# Patient Record
Sex: Female | Born: 1975 | Race: Black or African American | Hispanic: No | Marital: Married | Smoking: Never smoker
Health system: Southern US, Community
[De-identification: ages and names within clinical notes are randomized; demographics above are authoritative.]

## PROBLEM LIST (undated history)

## (undated) DIAGNOSIS — O09529 Supervision of elderly multigravida, unspecified trimester: Secondary | ICD-10-CM

## (undated) DIAGNOSIS — D649 Anemia, unspecified: Secondary | ICD-10-CM

## (undated) HISTORY — DX: Anemia, unspecified: D64.9

## (undated) HISTORY — PX: KNEE SURGERY: SHX244

## (undated) HISTORY — DX: Supervision of elderly multigravida, unspecified trimester: O09.529

---

## 2004-06-17 ENCOUNTER — Encounter (INDEPENDENT_AMBULATORY_CARE_PROVIDER_SITE_OTHER): Payer: Self-pay | Admitting: *Deleted

## 2004-06-17 ENCOUNTER — Ambulatory Visit (HOSPITAL_COMMUNITY): Admission: RE | Admit: 2004-06-17 | Discharge: 2004-06-17 | Payer: Self-pay | Admitting: Obstetrics & Gynecology

## 2005-04-05 ENCOUNTER — Encounter: Admission: RE | Admit: 2005-04-05 | Discharge: 2005-04-05 | Payer: Self-pay | Admitting: Emergency Medicine

## 2006-08-28 ENCOUNTER — Ambulatory Visit: Payer: Self-pay | Admitting: Family Medicine

## 2006-11-05 ENCOUNTER — Emergency Department (HOSPITAL_COMMUNITY): Admission: EM | Admit: 2006-11-05 | Discharge: 2006-11-05 | Payer: Self-pay | Admitting: Family Medicine

## 2009-03-01 ENCOUNTER — Inpatient Hospital Stay (HOSPITAL_COMMUNITY): Admission: AD | Admit: 2009-03-01 | Discharge: 2009-03-04 | Payer: Self-pay | Admitting: Obstetrics and Gynecology

## 2010-03-21 ENCOUNTER — Encounter: Payer: Self-pay | Admitting: Family Medicine

## 2010-05-16 LAB — CBC
HCT: 30.4 % — ABNORMAL LOW (ref 36.0–46.0)
HCT: 32.9 % — ABNORMAL LOW (ref 36.0–46.0)
Hemoglobin: 11.2 g/dL — ABNORMAL LOW (ref 12.0–15.0)
MCHC: 33.8 g/dL (ref 30.0–36.0)
MCV: 93 fL (ref 78.0–100.0)
RDW: 13.6 % (ref 11.5–15.5)
WBC: 11.5 10*3/uL — ABNORMAL HIGH (ref 4.0–10.5)

## 2010-05-16 LAB — RPR: RPR Ser Ql: NONREACTIVE

## 2010-07-16 NOTE — Op Note (Signed)
NAMEKAREENA, [name]             ACCOUNT NO.:  [id number]   MEDICAL RECORD NO.:  [id number]          PATIENT TYPE:  AMB   LOCATION:  SDC                           FACILITY:  [facility]   PHYSICIAN:  Genia Del, M.D.DATE OF BIRTH:  May 14, 1975   DATE OF PROCEDURE:  06/17/2004  DATE OF DISCHARGE:                                 OPERATIVE REPORT   PREOPERATIVE DIAGNOSIS:  7+ weeks gestation, undesired.   POSTOPERATIVE DIAGNOSIS:  7+ weeks gestation, undesired.   PROCEDURE:  Dilatation and evacuation.   SURGEON:  Genia Del, M.D.   ANESTHESIA:  Burnett Corrente, M.D.   PROCEDURE:  Under MAC analgesia, the patient was in the lithotomy position.  She was prepped with technical solution on the suprapubic, vulvar, and  vaginal areas.  The vaginal exam reveals an anteverted uterus about 8 cm,  mobile, no adnexal mass.  The cervix is long and closed, no bleeding.  A  speculum was inserted in the vagina.  The anterior lip of the cervix was  grasped with a tenaculum.  A paracervical block was done with Xylocaine 1%  plain 20 cc total at 4 and 8 o'clock.  Dilatation of the cervix was achieved  with Hagar dilators up to #31 without difficulty.  We then used a #9 curved  curette for suction of products of conception which were sent to pathology.  Then, a sharp curette was used to systematically curettage all intrauterine  surfaces.  The uterine sound was heard.  The suction was used to empty the  intrauterine cavity of any blood clots or products of conception.  The  uterus contracted well.  Hemostasis was adequate.  All instruments were  removed.  The estimated blood loss was about 50 cc.  No complications  occurred, and the patient was brought to the recovery room in good status.      ML/MEDQ  D:  06/17/2004  T:  06/17/2004  Job:  [id number]

## 2010-12-10 LAB — WET PREP, GENITAL: Yeast Wet Prep HPF POC: NONE SEEN

## 2010-12-10 LAB — GC/CHLAMYDIA PROBE AMP, GENITAL
Chlamydia, DNA Probe: NEGATIVE
GC Probe Amp, Genital: NEGATIVE

## 2011-06-26 ENCOUNTER — Encounter (HOSPITAL_COMMUNITY): Payer: Self-pay | Admitting: *Deleted

## 2011-06-26 ENCOUNTER — Emergency Department (HOSPITAL_COMMUNITY)
Admission: EM | Admit: 2011-06-26 | Discharge: 2011-06-26 | Disposition: A | Discharge status: Home / Self Care | Payer: No Typology Code available for payment source | Attending: Emergency Medicine | Admitting: Emergency Medicine

## 2011-06-26 DIAGNOSIS — M546 Pain in thoracic spine: Secondary | ICD-10-CM | POA: Insufficient documentation

## 2011-06-26 DIAGNOSIS — T148XXA Other injury of unspecified body region, initial encounter: Secondary | ICD-10-CM

## 2011-06-26 DIAGNOSIS — Z043 Encounter for examination and observation following other accident: Secondary | ICD-10-CM | POA: Insufficient documentation

## 2011-06-26 MED ORDER — METHOCARBAMOL 500 MG PO TABS: 500.0000 mg | ORAL_TABLET | Freq: Two times a day (BID) | ORAL | Status: AC

## 2011-06-26 MED ORDER — TRAMADOL HCL 50 MG PO TABS: 50.0000 mg | ORAL_TABLET | Freq: Four times a day (QID) | ORAL | Status: AC | PRN

## 2011-06-26 MED ORDER — IBUPROFEN 800 MG PO TABS: 800.0000 mg | ORAL_TABLET | Freq: Three times a day (TID) | ORAL | Status: AC
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
## 2011-06-28 NOTE — ED Provider Notes (Signed)
Medical screening examination/treatment/procedure(s) were performed by non-physician practitioner and as supervising physician I was immediately available for consultation/collaboration.  Raeford Razor, MD 06/28/11 1626

## 2015-08-22 ENCOUNTER — Emergency Department (HOSPITAL_COMMUNITY)
Admission: EM | Admit: 2015-08-22 | Discharge: 2015-08-22 | Disposition: A | Discharge status: Home / Self Care | Payer: BLUE CROSS/BLUE SHIELD | Attending: Emergency Medicine | Admitting: Emergency Medicine

## 2015-08-22 ENCOUNTER — Encounter (HOSPITAL_COMMUNITY): Payer: Self-pay | Admitting: Emergency Medicine

## 2015-08-22 ENCOUNTER — Emergency Department (HOSPITAL_COMMUNITY): Payer: BLUE CROSS/BLUE SHIELD

## 2015-08-22 DIAGNOSIS — Y92524 Gas station as the place of occurrence of the external cause: Secondary | ICD-10-CM | POA: Diagnosis not present

## 2015-08-22 DIAGNOSIS — Y9301 Activity, walking, marching and hiking: Secondary | ICD-10-CM | POA: Insufficient documentation

## 2015-08-22 DIAGNOSIS — S8991XA Unspecified injury of right lower leg, initial encounter: Secondary | ICD-10-CM | POA: Diagnosis present

## 2015-08-22 DIAGNOSIS — S8001XA Contusion of right knee, initial encounter: Secondary | ICD-10-CM | POA: Diagnosis not present

## 2015-08-22 DIAGNOSIS — Y999 Unspecified external cause status: Secondary | ICD-10-CM | POA: Diagnosis not present

## 2015-08-22 MED ORDER — HYDROCODONE-ACETAMINOPHEN 5-325 MG PO TABS: 1.0000 | ORAL_TABLET | Freq: Four times a day (QID) | ORAL | Status: DC | PRN

## 2015-08-22 MED ORDER — IBUPROFEN 400 MG PO TABS
600.0000 mg | ORAL_TABLET | Freq: Once | ORAL | Status: AC
  Administered 2015-08-22: 600 mg via ORAL
  Filled 2015-08-22: qty 1

## 2015-08-22 MED ORDER — HYDROCODONE-ACETAMINOPHEN 5-325 MG PO TABS
1.0000 | ORAL_TABLET | Freq: Once | ORAL | Status: AC
  Administered 2015-08-22: 1 via ORAL
  Filled 2015-08-22: qty 1

## 2015-08-22 NOTE — ED Notes (Signed)
Patient transported to X-ray 
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
## 2015-08-22 NOTE — ED Notes (Signed)
Registration just changed pts DOB to reflect June 06, 1975, changed on prescription that was already printed out.

## 2015-08-22 NOTE — Discharge Instructions (Signed)
You can take ibuprofen, available over the counter as needed for pain.  Drink plenty of fluids and keep your leg elevated.  Get rechecked immediately if you develop worsening pain, increased swelling in your leg, or dark colored urine.     Contusion A contusion is a deep bruise. Contusions are the result of a blunt injury to tissues and muscle fibers under the skin. The injury causes bleeding under the skin. The skin overlying the contusion may turn blue, purple, or yellow. Minor injuries will give you a painless contusion, but more severe contusions may stay painful and swollen for a few weeks.  CAUSES  This condition is usually caused by a blow, trauma, or direct force to an area of the body. SYMPTOMS  Symptoms of this condition include:  Swelling of the injured area.  Pain and tenderness in the injured area.  Discoloration. The area may have redness and then turn blue, purple, or yellow. DIAGNOSIS  This condition is diagnosed based on a physical exam and medical history. An X-ray, CT scan, or MRI may be needed to determine if there are any associated injuries, such as broken bones (fractures). TREATMENT  Specific treatment for this condition depends on what area of the body was injured. In general, the best treatment for a contusion is resting, icing, applying pressure to (compression), and elevating the injured area. This is often called the RICE strategy. Over-the-counter anti-inflammatory medicines may also be recommended for pain control.  HOME CARE INSTRUCTIONS   Rest the injured area.  If directed, apply ice to the injured area:  Put ice in a plastic bag.  Place a towel between your skin and the bag.  Leave the ice on for 20 minutes, 2-3 times per day.  If directed, apply light compression to the injured area using an elastic bandage. Make sure the bandage is not wrapped too tightly. Remove and reapply the bandage as directed by your health care provider.  If possible, raise  (elevate) the injured area above the level of your heart while you are sitting or lying down.  Take over-the-counter and prescription medicines only as told by your health care provider. SEEK MEDICAL CARE IF:  Your symptoms do not improve after several days of treatment.  Your symptoms get worse.  You have difficulty moving the injured area. SEEK IMMEDIATE MEDICAL CARE IF:   You have severe pain.  You have numbness in a hand or foot.  Your hand or foot turns pale or cold.   This information is not intended to replace advice given to you by your health care provider. Make sure you discuss any questions you have with your health care provider.   Document Released: 11/24/2004 Document Revised: 11/05/2014 Document Reviewed: 07/02/2014 Elsevier Interactive Patient Education 2016 [employer] Acute Compartment Syndrome Compartment syndrome is a painful condition that occurs when swelling and pressure build up in a body space (compartment) of the arms or legs. Groups of muscles, nerves, and blood vessels in the arms and legs are separated into various compartments. Each compartment is surrounded by tough layers of tissue called fascia. In compartment syndrome, pressure builds up within the layers of fascia and begins to push on the structures within that compartment.  In acute compartment syndrome, the pressure builds up suddenly, often as the result of an injury. This is a surgical emergency. When a muscle in the compartment moves, you may feel severe pain. If pressure continues to increase, it can block the flow of blood in the  smallest blood vessels (capillaries). Then, the nerves and muscles in the compartment cannot get enough oxygen and nutrients (substances needed for survival). They will start to die within 4-8 hours. That is why the pressure needs to be relieved immediately. Identifying the condition early and treating it quickly can prevent most problems. CAUSES  Various things can  lead to compartment syndrome. Possible causes include:   Injury. Some injuries can cause swelling or bleeding in a compartment. This can lead to compartment syndrome. Injuries that may cause this problem include:  Broken bones, especially the long bones of the arms and legs.  Crushing injuries.  Penetrating injuries, such as a knife wound that punctures the skin and tissue underneath.  Badly bruised muscles.  Poisonous bites, such as a snake bite.  Severe burns.  Blocked blood flow. This could result from:  A cast or bandage that is too tight.  A surgical procedure. Blood flow sometimes has to be stopped for a while during a surgery, usually with a tourniquet.  Lying for too long in a position that restricts blood flow. This can happen in people who have nerve damage or if a person is unconscious for a long time.  Drugs used to build up muscles (anabolic steroids).  Drugs that keep the blood from forming clots (blood thinners). SIGNS AND SYMPTOMS  The most common symptom of compartment syndrome is pain. The pain may:   Get worse when moving or stretching the affected body part.  Be more severe than it should be for an injury.  Come along with a feeling of tingling or burning.  Become worse when the area is pushed or squeezed.  Be unaffected by pain medicine. Other symptoms include:   A feeling of tightness or fullness in the affected area.   A loss of feeling.  Weakness in the area.  Loss of movement.  Skin becoming pale, tight, and shiny over the painful area.  DIAGNOSIS  Your health care provider may suspect the problem based on how you describe the pain. The diagnosis is made by using a special device that measures the pressure in the affected area. Blood tests, X-rays, or an ultrasound exam may be done to help rule out other problems.  TREATMENT  Compartment syndrome is a surgical emergency. It should be treated very quickly.   First-aid treatment is given  first. This may include:  Promptly treating an injury.  Loosening or removing any cast, bandage, or external wrap that may be causing pain.  Raising the painful arm or leg to the same level as the heart.  Giving oxygen.  Giving fluid through an IV access tube that is put into a vein in the hand or arm.  Surgery (fasciotomy) is needed to relieve the pressure and help prevent permanent damage. In this surgery, cuts (incisions) are made through the fascia to relieve the pressure in the compartment.   This information is not intended to replace advice given to you by your health care provider. Make sure you discuss any questions you have with your health care provider.   Document Released: 02/02/2009 Document Revised: 10/17/2012 Document Reviewed: 09/18/2012 Elsevier Interactive Patient Education [employer] Else[employer]

## 2015-08-22 NOTE — ED Notes (Signed)
Downgraded by Dr Madilyn Hookees
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
## 2015-08-24 ENCOUNTER — Encounter (HOSPITAL_COMMUNITY): Payer: Self-pay | Admitting: *Deleted

## 2017-01-26 LAB — OB RESULTS CONSOLE ABO/RH: RH TYPE: POSITIVE

## 2017-01-26 LAB — OB RESULTS CONSOLE RPR: RPR: NONREACTIVE

## 2017-01-26 LAB — OB RESULTS CONSOLE ANTIBODY SCREEN: ANTIBODY SCREEN: NEGATIVE

## 2017-01-26 LAB — OB RESULTS CONSOLE RUBELLA ANTIBODY, IGM: Rubella: IMMUNE

## 2017-01-26 LAB — OB RESULTS CONSOLE HEPATITIS B SURFACE ANTIGEN: Hepatitis B Surface Ag: NEGATIVE

## 2017-01-26 LAB — OB RESULTS CONSOLE GC/CHLAMYDIA
Chlamydia: NEGATIVE
Gonorrhea: NEGATIVE

## 2017-01-26 LAB — OB RESULTS CONSOLE HIV ANTIBODY (ROUTINE TESTING): HIV: NONREACTIVE

## 2017-02-28 NOTE — L&D Delivery Note (Signed)
Delivery Note At 9:41 AM a viable female was delivered in hands and knees position via Vaginal, Spontaneous (Presentation:direct OA ).  APGAR: 8, 9; weight 5 lb 9.8 oz (2545 g).   Placenta status: placenta , spontaneous.  Cord:  with the following complications: Nuchal cord x 1, compound presentation of right hand.  Cord pH: n/a Baby placed at bedside to awaiting NICU team.  FSE cut after delivery.  Removed from scalp but entangled in hair. Cut with scissors Anesthesia:  Lidocacine 1% Episiotomy: None Lacerations: 1st degree; right Labial Suture Repair: 3.0 vicryl 4.0 Est. Blood Loss (mL): 400  Mom to postpartum.  Baby to Couplet care / Skin to Skin.  Stephanie Hood 08/13/2017, 10:44 AM

## 2017-07-25 LAB — OB RESULTS CONSOLE GBS: STREP GROUP B AG: POSITIVE

## 2017-08-08 ENCOUNTER — Encounter (HOSPITAL_COMMUNITY): Payer: Self-pay | Admitting: *Deleted

## 2017-08-08 ENCOUNTER — Telehealth (HOSPITAL_COMMUNITY): Payer: Self-pay | Admitting: *Deleted

## 2017-08-08 NOTE — Telephone Encounter (Signed)
Preadmission screen  

## 2017-08-09 ENCOUNTER — Other Ambulatory Visit: Payer: Self-pay | Admitting: Obstetrics and Gynecology

## 2017-08-12 ENCOUNTER — Encounter (HOSPITAL_COMMUNITY): Payer: Self-pay

## 2017-08-12 ENCOUNTER — Inpatient Hospital Stay (HOSPITAL_COMMUNITY)
Admission: AD | Admit: 2017-08-12 | Discharge: 2017-08-16 | DRG: 807 | Disposition: A | Discharge status: Home / Self Care | Payer: BLUE CROSS/BLUE SHIELD | Attending: Obstetrics & Gynecology | Admitting: Obstetrics & Gynecology

## 2017-08-12 DIAGNOSIS — O326XX0 Maternal care for compound presentation, not applicable or unspecified: Secondary | ICD-10-CM | POA: Diagnosis present

## 2017-08-12 DIAGNOSIS — O1414 Severe pre-eclampsia complicating childbirth: Secondary | ICD-10-CM | POA: Diagnosis present

## 2017-08-12 DIAGNOSIS — R030 Elevated blood-pressure reading, without diagnosis of hypertension: Secondary | ICD-10-CM | POA: Diagnosis present

## 2017-08-12 DIAGNOSIS — O99824 Streptococcus B carrier state complicating childbirth: Secondary | ICD-10-CM | POA: Diagnosis present

## 2017-08-12 DIAGNOSIS — Z3A38 38 weeks gestation of pregnancy: Secondary | ICD-10-CM

## 2017-08-12 DIAGNOSIS — O1490 Unspecified pre-eclampsia, unspecified trimester: Secondary | ICD-10-CM | POA: Diagnosis present

## 2017-08-12 LAB — COMPREHENSIVE METABOLIC PANEL
ALBUMIN: 2.7 g/dL — AB (ref 3.5–5.0)
ALK PHOS: 147 U/L — AB (ref 38–126)
ALT: 27 U/L (ref 14–54)
ANION GAP: 9 (ref 5–15)
AST: 38 U/L (ref 15–41)
BILIRUBIN TOTAL: 0.5 mg/dL (ref 0.3–1.2)
BUN: 10 mg/dL (ref 6–20)
CALCIUM: 8.8 mg/dL — AB (ref 8.9–10.3)
CO2: 20 mmol/L — ABNORMAL LOW (ref 22–32)
CREATININE: 0.77 mg/dL (ref 0.44–1.00)
Chloride: 111 mmol/L (ref 101–111)
GFR calc Af Amer: >60 mL/min (ref >60)
GFR calc non Af Amer: >60 mL/min (ref >60)
GLUCOSE: 97 mg/dL (ref 65–99)
Potassium: 3.7 mmol/L (ref 3.5–5.1)
Sodium: 140 mmol/L (ref 135–145)
TOTAL PROTEIN: 5.5 g/dL — AB (ref 6.5–8.1)

## 2017-08-12 LAB — TYPE AND SCREEN
ABO/RH(D): O POS
Antibody Screen: NEGATIVE

## 2017-08-12 LAB — URINALYSIS, ROUTINE W REFLEX MICROSCOPIC
BILIRUBIN URINE: NEGATIVE
GLUCOSE, UA: NEGATIVE mg/dL
HGB URINE DIPSTICK: NEGATIVE
KETONES UR: NEGATIVE mg/dL
NITRITE: NEGATIVE
PH: 6 (ref 5.0–8.0)
Protein, ur: >=300 mg/dL — AB
SPECIFIC GRAVITY, URINE: 1.018 (ref 1.005–1.030)

## 2017-08-12 LAB — CBC
HEMATOCRIT: 30.3 % — AB (ref 36.0–46.0)
Hemoglobin: 9.9 g/dL — ABNORMAL LOW (ref 12.0–15.0)
MCH: 28.8 pg (ref 26.0–34.0)
MCHC: 32.7 g/dL (ref 30.0–36.0)
MCV: 88.1 fL (ref 78.0–100.0)
Platelets: 143 10*3/uL — ABNORMAL LOW (ref 150–400)
RBC: 3.44 MIL/uL — ABNORMAL LOW (ref 3.87–5.11)
RDW: 14.2 % (ref 11.5–15.5)
WBC: 7.9 10*3/uL (ref 4.0–10.5)

## 2017-08-12 LAB — PROTEIN / CREATININE RATIO, URINE
Creatinine, Urine: 217 mg/dL
Protein Creatinine Ratio: 1.92 mg/mg{Cre} — ABNORMAL HIGH (ref 0.00–0.15)
Total Protein, Urine: 416 mg/dL

## 2017-08-12 MED ORDER — LABETALOL HCL 5 MG/ML IV SOLN
20.0000 mg | INTRAVENOUS | Status: AC | PRN
  Administered 2017-08-12: 20 mg via INTRAVENOUS
  Administered 2017-08-13: 80 mg via INTRAVENOUS
  Administered 2017-08-13: 40 mg via INTRAVENOUS
  Filled 2017-08-12: qty 8
  Filled 2017-08-12: qty 16

## 2017-08-12 MED ORDER — MISOPROSTOL 25 MCG QUARTER TABLET
25.0000 ug | ORAL_TABLET | ORAL | Status: DC | PRN
  Administered 2017-08-12 – 2017-08-13 (×2): 25 ug via VAGINAL
  Filled 2017-08-12 (×3): qty 1

## 2017-08-12 MED ORDER — TERBUTALINE SULFATE 1 MG/ML IJ SOLN: 0.2500 mg | Freq: Once | INTRAMUSCULAR | Status: DC | PRN

## 2017-08-12 MED ORDER — LACTATED RINGERS IV SOLN
500.0000 mL | INTRAVENOUS | Status: DC | PRN
  Administered 2017-08-13: 500 mL via INTRAVENOUS

## 2017-08-12 MED ORDER — LACTATED RINGERS IV SOLN
INTRAVENOUS | Status: DC
  Administered 2017-08-12 – 2017-08-13 (×2): via INTRAVENOUS

## 2017-08-12 MED ORDER — OXYTOCIN BOLUS FROM INFUSION: 500.0000 mL | Freq: Once | INTRAVENOUS | Status: DC

## 2017-08-12 MED ORDER — LIDOCAINE HCL (PF) 1 % IJ SOLN
30.0000 mL | INTRAMUSCULAR | Status: AC | PRN
  Administered 2017-08-13: 30 mL via SUBCUTANEOUS
  Filled 2017-08-12: qty 30

## 2017-08-12 MED ORDER — ZOLPIDEM TARTRATE 5 MG PO TABS
5.0000 mg | ORAL_TABLET | Freq: Once | ORAL | Status: AC
  Administered 2017-08-12: 5 mg via ORAL
  Filled 2017-08-12: qty 1

## 2017-08-12 MED ORDER — OXYCODONE-ACETAMINOPHEN 5-325 MG PO TABS: 2.0000 | ORAL_TABLET | ORAL | Status: DC | PRN

## 2017-08-12 MED ORDER — SODIUM CHLORIDE 0.9 % IV SOLN
5.0000 10*6.[IU] | Freq: Once | INTRAVENOUS | Status: AC
  Administered 2017-08-12: 5 10*6.[IU] via INTRAVENOUS
  Filled 2017-08-12: qty 5

## 2017-08-12 MED ORDER — HYDRALAZINE HCL 20 MG/ML IJ SOLN
10.0000 mg | Freq: Once | INTRAMUSCULAR | Status: AC | PRN
  Administered 2017-08-13: 10 mg via INTRAVENOUS
  Filled 2017-08-12: qty 1

## 2017-08-12 MED ORDER — ONDANSETRON HCL 4 MG/2ML IJ SOLN: 4.0000 mg | Freq: Four times a day (QID) | INTRAMUSCULAR | Status: DC | PRN

## 2017-08-12 MED ORDER — OXYCODONE-ACETAMINOPHEN 5-325 MG PO TABS: 1.0000 | ORAL_TABLET | ORAL | Status: DC | PRN

## 2017-08-12 MED ORDER — SOD CITRATE-CITRIC ACID 500-334 MG/5ML PO SOLN: 30.0000 mL | ORAL | Status: DC | PRN

## 2017-08-12 MED ORDER — LABETALOL HCL 5 MG/ML IV SOLN
INTRAVENOUS | Status: AC
  Filled 2017-08-12: qty 4

## 2017-08-12 MED ORDER — ACETAMINOPHEN 325 MG PO TABS
650.0000 mg | ORAL_TABLET | ORAL | Status: DC | PRN
  Administered 2017-08-13: 650 mg via ORAL
  Filled 2017-08-12: qty 2

## 2017-08-12 MED ORDER — PENICILLIN G POT IN DEXTROSE 60000 UNIT/ML IV SOLN
3.0000 10*6.[IU] | INTRAVENOUS | Status: DC
  Administered 2017-08-13 (×3): 3 10*6.[IU] via INTRAVENOUS
  Filled 2017-08-12 (×4): qty 50

## 2017-08-12 MED ORDER — OXYTOCIN 40 UNITS IN LACTATED RINGERS INFUSION - SIMPLE MED
2.5000 [IU]/h | INTRAVENOUS | Status: DC
  Administered 2017-08-13: 2.5 [IU]/h via INTRAVENOUS
  Filled 2017-08-12: qty 1000

## 2017-08-12 NOTE — H&P (Signed)
Stephanie Hood is a 42 y.o. female, G4P3003 at 38.4  weeks, presenting for increased BP.  Denies headache, blurred vision or epigastric pain.  FM+.  States has had increased BP in office.  Was suppose to be induced on Tuesday.  Pregnancy hx remarkable for GBS positive and AMA.  There are no active problems to display for this patient.   History of present pregnancy: Patient entered care at 10.2 weeks.   EDC of 08/22/2017 was established by LMP.   Anatomy scan:  20 weeks, with normal findings and an anterior placenta.   Additional US evaluations:  Growth.   Significant prenatal events:increased BP  Last evaluation:  Friday  OB History    Gravida  4   Para  3   Term  3   Preterm  0   AB  0   Living  2     SAB  0   TAB  0   Ectopic  0   Multiple      Live Births  2          Past Medical History:  Diagnosis Date  . AMA (advanced maternal age) multigravida 35+   . Anemia    Past Surgical History:  Procedure Laterality Date  . KNEE SURGERY     Family History: family history is not on file. Social History:  reports that she has never smoked. She has never used smokeless tobacco. She reports that she drinks alcohol. She reports that she does not use drugs.   Prenatal Transfer Tool  Maternal Diabetes: No Genetic Screening: Negative Maternal Ultrasounds/Referrals: None Fetal Ultrasounds or other Referrals:  None Maternal Substance Abuse:  No Significant Maternal Medications:  None Significant Maternal Lab Results: None  TDAP declined Flu declined  JXB:JYNWGNROS:Review of Systems  Constitutional: Negative.   HENT: Negative.   Eyes: Negative.   Cardiovascular: Positive for leg swelling.  Skin: Negative.      No Known Allergies     Blood pressure (!) 169/86, pulse 63, temperature 98.2 F (36.8 C), temperature source Oral, resp. rate 18, height 5\' 6"  (1.676 m), weight 103 kg (227 lb), last menstrual period 11/15/2016, SpO2 100 %.  Chest clear Heart RRR  without murmur Abd gravid, NT, FH apporpriate Pelvic: Deferred Ext: +1/+1 pitting edema, neg clonus  FHR: Category 1  FHT BL 135 accels noted.  Variability present UCs:  None  Prenatal labs: ABO, Rh: O/Positive/-- (11/29 0000) Antibody: Negative (11/29 0000) Rubella:  Immune (11/29 0000) RPR: Nonreactive (11/29 0000)  HBsAg: Negative (11/29 0000)  HIV: Non-reactive (11/29 0000)  GBS: Positive (05/28 0000) Sickle cell/Hgb electrophoresis:  AA Pap:  2018 GC:  Neg Chlamydia:  Neg Genetic screenings:  Declined Glucola: 92 Other:   Hgb 11.7 at NOB, 10.8 at 28 weeks       Assessment/Plan: IUP at 38.4 pre eclampsia with severe features Cat 1 strip AMA GBS positive  Plan: Admit to Birthing Suite  Routine [facility] orders Pain med/epidural prn PCN G for GBS prophylaxis   Henderson NewcomerNancy Jean ProtheroCNM, MSN 08/12/2017, 8:52 PM

## 2017-08-12 NOTE — MAU Note (Signed)
Pt here for elevated BPs. States she was at the office yesterday and BP was 140'/90's and she was told she had "a lot of protein in urine". States she had a HA today-has eased since, but checked her BP today and it has been 140's-160's/90's. Denies vision changes or RUQ pain. States she "feels feverish". Pt denies LOF or vaginal bleeding. Reports good fetal movement.

## 2017-08-13 ENCOUNTER — Other Ambulatory Visit: Payer: Self-pay

## 2017-08-13 ENCOUNTER — Encounter (HOSPITAL_COMMUNITY): Payer: Self-pay | Admitting: *Deleted

## 2017-08-13 LAB — CBC
HEMATOCRIT: 33.4 % — AB (ref 36.0–46.0)
Hemoglobin: 11.1 g/dL — ABNORMAL LOW (ref 12.0–15.0)
MCH: 29 pg (ref 26.0–34.0)
MCHC: 33.2 g/dL (ref 30.0–36.0)
MCV: 87.2 fL (ref 78.0–100.0)
Platelets: 151 10*3/uL (ref 150–400)
RBC: 3.83 MIL/uL — AB (ref 3.87–5.11)
RDW: 14.2 % (ref 11.5–15.5)
WBC: 12.3 10*3/uL — AB (ref 4.0–10.5)

## 2017-08-13 LAB — RPR: RPR Ser Ql: NONREACTIVE

## 2017-08-13 LAB — ABO/RH: ABO/RH(D): O POS

## 2017-08-13 MED ORDER — LACTATED RINGERS IV SOLN
INTRAVENOUS | Status: DC
  Administered 2017-08-13: 18:00:00 via INTRAVENOUS

## 2017-08-13 MED ORDER — FENTANYL 2.5 MCG/ML BUPIVACAINE 1/10 % EPIDURAL INFUSION (WH - ANES)
INTRAMUSCULAR | Status: AC
  Filled 2017-08-13: qty 100

## 2017-08-13 MED ORDER — FENTANYL CITRATE (PF) 100 MCG/2ML IJ SOLN
100.0000 ug | INTRAMUSCULAR | Status: DC | PRN
  Administered 2017-08-13 (×2): 100 ug via INTRAVENOUS
  Filled 2017-08-13: qty 2

## 2017-08-13 MED ORDER — OXYTOCIN 40 UNITS IN LACTATED RINGERS INFUSION - SIMPLE MED: 1.0000 m[IU]/min | INTRAVENOUS | Status: DC

## 2017-08-13 MED ORDER — FENTANYL 2.5 MCG/ML BUPIVACAINE 1/10 % EPIDURAL INFUSION (WH - ANES): 14.0000 mL/h | INTRAMUSCULAR | Status: DC | PRN

## 2017-08-13 MED ORDER — MAGNESIUM SULFATE BOLUS VIA INFUSION
4.0000 g | Freq: Once | INTRAVENOUS | Status: AC
  Administered 2017-08-13: 4 g via INTRAVENOUS
  Filled 2017-08-13: qty 500

## 2017-08-13 MED ORDER — SIMETHICONE 80 MG PO CHEW: 80.0000 mg | CHEWABLE_TABLET | ORAL | Status: DC | PRN

## 2017-08-13 MED ORDER — PHENYLEPHRINE 40 MCG/ML (10ML) SYRINGE FOR IV PUSH (FOR BLOOD PRESSURE SUPPORT)
80.0000 ug | PREFILLED_SYRINGE | INTRAVENOUS | Status: DC | PRN
  Filled 2017-08-13: qty 5

## 2017-08-13 MED ORDER — LACTATED RINGERS IV SOLN: 500.0000 mL | Freq: Once | INTRAVENOUS | Status: DC

## 2017-08-13 MED ORDER — PHENYLEPHRINE 40 MCG/ML (10ML) SYRINGE FOR IV PUSH (FOR BLOOD PRESSURE SUPPORT)
PREFILLED_SYRINGE | INTRAVENOUS | Status: AC
  Filled 2017-08-13: qty 10

## 2017-08-13 MED ORDER — MISOPROSTOL 200 MCG PO TABS
1000.0000 ug | ORAL_TABLET | Freq: Once | ORAL | Status: AC
Start: 2017-08-13 — End: 2017-08-13
  Administered 2017-08-13: 1000 ug via RECTAL

## 2017-08-13 MED ORDER — ONDANSETRON HCL 4 MG/2ML IJ SOLN: 4.0000 mg | INTRAMUSCULAR | Status: DC | PRN

## 2017-08-13 MED ORDER — MISOPROSTOL 200 MCG PO TABS: 1000.0000 ug | ORAL_TABLET | Freq: Once | ORAL | Status: DC | PRN

## 2017-08-13 MED ORDER — PRENATAL MULTIVITAMIN CH
1.0000 | ORAL_TABLET | Freq: Every day | ORAL | Status: DC
  Administered 2017-08-13 – 2017-08-16 (×4): 1 via ORAL
  Filled 2017-08-13 (×4): qty 1

## 2017-08-13 MED ORDER — ACETAMINOPHEN 325 MG PO TABS
650.0000 mg | ORAL_TABLET | ORAL | Status: DC | PRN
  Administered 2017-08-16: 650 mg via ORAL
  Filled 2017-08-13: qty 2

## 2017-08-13 MED ORDER — DIPHENHYDRAMINE HCL 50 MG/ML IJ SOLN: 12.5000 mg | INTRAMUSCULAR | Status: DC | PRN

## 2017-08-13 MED ORDER — EPHEDRINE 5 MG/ML INJ
10.0000 mg | INTRAVENOUS | Status: DC | PRN
  Filled 2017-08-13: qty 2

## 2017-08-13 MED ORDER — ONDANSETRON HCL 4 MG PO TABS: 4.0000 mg | ORAL_TABLET | ORAL | Status: DC | PRN

## 2017-08-13 MED ORDER — MAGNESIUM SULFATE 40 G IN LACTATED RINGERS - SIMPLE
2.0000 g/h | INTRAVENOUS | Status: DC
  Administered 2017-08-13: 2 g/h via INTRAVENOUS
  Filled 2017-08-13: qty 500
  Filled 2017-08-13: qty 40

## 2017-08-13 MED ORDER — TETANUS-DIPHTH-ACELL PERTUSSIS 5-2.5-18.5 LF-MCG/0.5 IM SUSP: 0.5000 mL | Freq: Once | INTRAMUSCULAR | Status: DC

## 2017-08-13 MED ORDER — OXYCODONE HCL 5 MG PO TABS: 5.0000 mg | ORAL_TABLET | ORAL | Status: DC | PRN

## 2017-08-13 MED ORDER — LABETALOL HCL 5 MG/ML IV SOLN
20.0000 mg | INTRAVENOUS | Status: DC | PRN
  Filled 2017-08-13: qty 4

## 2017-08-13 MED ORDER — COCONUT OIL OIL
1.0000 "application " | TOPICAL_OIL | Status: DC | PRN
  Administered 2017-08-13 – 2017-08-14 (×2): 1 via TOPICAL
  Filled 2017-08-13 (×2): qty 120

## 2017-08-13 MED ORDER — HYDRALAZINE HCL 20 MG/ML IJ SOLN: 5.0000 mg | Freq: Once | INTRAMUSCULAR | Status: DC

## 2017-08-13 MED ORDER — WITCH HAZEL-GLYCERIN EX PADS: 1.0000 "application " | MEDICATED_PAD | CUTANEOUS | Status: DC | PRN

## 2017-08-13 MED ORDER — DIBUCAINE 1 % RE OINT: 1.0000 "application " | TOPICAL_OINTMENT | RECTAL | Status: DC | PRN

## 2017-08-13 MED ORDER — IBUPROFEN 600 MG PO TABS
600.0000 mg | ORAL_TABLET | Freq: Four times a day (QID) | ORAL | Status: DC
  Administered 2017-08-13 – 2017-08-16 (×9): 600 mg via ORAL
  Filled 2017-08-13 (×11): qty 1

## 2017-08-13 MED ORDER — MISOPROSTOL 200 MCG PO TABS
ORAL_TABLET | ORAL | Status: AC
  Administered 2017-08-13: 1000 ug via RECTAL
  Filled 2017-08-13: qty 5

## 2017-08-13 MED ORDER — FENTANYL CITRATE (PF) 100 MCG/2ML IJ SOLN
INTRAMUSCULAR | Status: AC
  Administered 2017-08-13: 100 ug via INTRAVENOUS
  Filled 2017-08-13: qty 2

## 2017-08-13 MED ORDER — DIPHENHYDRAMINE HCL 25 MG PO CAPS: 25.0000 mg | ORAL_CAPSULE | Freq: Four times a day (QID) | ORAL | Status: DC | PRN

## 2017-08-13 MED ORDER — BENZOCAINE-MENTHOL 20-0.5 % EX AERO
1.0000 "application " | INHALATION_SPRAY | CUTANEOUS | Status: DC | PRN
  Administered 2017-08-14: 1 via TOPICAL
  Filled 2017-08-13: qty 56

## 2017-08-13 MED ORDER — ZOLPIDEM TARTRATE 5 MG PO TABS: 5.0000 mg | ORAL_TABLET | Freq: Every evening | ORAL | Status: DC | PRN

## 2017-08-13 MED ORDER — OXYCODONE HCL 5 MG PO TABS: 10.0000 mg | ORAL_TABLET | ORAL | Status: DC | PRN

## 2017-08-13 MED ORDER — OXYTOCIN 40 UNITS IN LACTATED RINGERS INFUSION - SIMPLE MED: 2.5000 [IU]/h | INTRAVENOUS | Status: DC | PRN

## 2017-08-13 MED ORDER — SENNOSIDES-DOCUSATE SODIUM 8.6-50 MG PO TABS
2.0000 | ORAL_TABLET | ORAL | Status: DC
  Administered 2017-08-13: 2 via ORAL
  Filled 2017-08-13 (×2): qty 2

## 2017-08-13 MED ORDER — MAGNESIUM HYDROXIDE 400 MG/5ML PO SUSP: 30.0000 mL | ORAL | Status: DC | PRN

## 2017-08-13 NOTE — Lactation Note (Signed)
This note was copied from a baby's chart. Lactation Consultation Note  Patient Name: Stephanie Hood Today's Date: 08/13/2017 Reason for consult: Initial assessment;Early term 3337-38.6wks  8 hours old early term female who is now being partially BF and formula fed by her mother, she's a P4 and experienced BF. She was able to BF her last child for 3 1/2 years, and she also has a DEBP at home; she brought it to the hospital. RN has already set her up with a DEBP but mom has not started pumping yet. LC noticed that the tubing on the pump was loose and readjusted and made mom aware that makes a difference in the suction.   Baby was already nursing when entering the room, noticed she had a big wide open mouth but the sucking pattern had already slowed down. LC did some breast compressions and breast massage and baby started sucking actively again, but no swallows were heard. Both nipples looked intact upon examination with no signs of trauma, mom has round everted nipples. Baby self released from the breast shortly after and mom proceed to keep her STS.   Mom has already started supplementing baby with Similac 22 calorie formula, she used a regular syringe; brought a curved tip syringe for mom to supplement if that's what she wishes to do.  Encouraged mom to keep nursing baby 8-12 times/24 hours or sooner if feeding cues are present. If baby is not cueing in a 3 hour period, she'll place her STS to the breast to give her an opportunity to feed. Mom will start pumping today, preferably after feedings. Will continue supplementing with curved tip syringe after feedings according the formula guidelines. BF brochure, BF resources and feeding diary were shared, mom is aware of LC services and will call PRN.  Maternal Data Formula Feeding for Exclusion: No Has patient been taught Hand Expression?: Yes Does the patient have breastfeeding experience prior to this delivery?: Yes  Feeding Feeding Type:  Breast Fed  LATCH Score Latch: Repeated attempts needed to sustain latch, nipple held in mouth throughout feeding, stimulation needed to elicit sucking reflex.  Audible Swallowing: None  Type of Nipple: Everted at rest and after stimulation  Comfort (Breast/Nipple): Soft / non-tender  Hold (Positioning): No assistance needed to correctly position infant at breast.(Minimal assistance required)  LATCH Score: 7  Interventions Interventions: Breast feeding basics reviewed;Assisted with latch;Skin to skin;Breast massage;Breast compression;DEBP;Adjust position  Lactation Tools Discussed/Used Tools: Pump Breast pump type: Double-Electric Breast Pump WIC Program: No Pump Review: Setup, frequency, and cleaning;Milk Storage Initiated by:: RN, LC Date initiated:: 08/13/17   Consult Status Consult Status: Follow-up Date: 08/14/17 Follow-up type: In-patient    [name] [name] [name] 08/13/2017, 6:10 PM

## 2017-08-13 NOTE — Progress Notes (Signed)
Subjective: Breathing with contractions.  Requesting pain medication  Objective: BP 130/73 (BP Location: Left Arm)   Pulse 68   Temp 98.5 F (36.9 C) (Oral)   Resp 16   Ht 5\' 6"  (1.676 m)   Wt 103 kg (227 lb)   LMP 11/15/2016   SpO2 100%   BMI 36.64 kg/m  I/O last 3 completed shifts: In: 1422.9 [I.V.:1322.9; IV Piggyback:100] Out: 800 [Urine:800] No intake/output data recorded.  FHT: Category 1  Fht 120 BL accels noted, variability present early decels noted with contractions. UC:   3 in 10 minutes SVE:   2/50/-2  Assessment:  IUP at 38.4 pre eclampsia with severe features Cat 1 strip AMA GBS positive  Plan: BP stable.  Discussed use of pitocin, risk and side effects, mec of action.  Patient declined.  Monitor progress  Kenney HousemanNancy Jean Prothero CNM, MSN 08/13/2017, 7:35 AM

## 2017-08-13 NOTE — Progress Notes (Signed)
Harriett SineNancy CNM at patient Stephanie Hood for cervical check. RN not present at this time. CNM report that patient is requesting main medication and that she checked her cervix. She is now 2/50/-2.

## 2017-08-14 LAB — CBC
HCT: 29.5 % — ABNORMAL LOW (ref 36.0–46.0)
Hemoglobin: 10 g/dL — ABNORMAL LOW (ref 12.0–15.0)
MCH: 29.7 pg (ref 26.0–34.0)
MCHC: 33.9 g/dL (ref 30.0–36.0)
MCV: 87.5 fL (ref 78.0–100.0)
PLATELETS: 143 10*3/uL — AB (ref 150–400)
RBC: 3.37 MIL/uL — ABNORMAL LOW (ref 3.87–5.11)
RDW: 14.3 % (ref 11.5–15.5)
WBC: 14.3 10*3/uL — ABNORMAL HIGH (ref 4.0–10.5)

## 2017-08-14 LAB — COMPREHENSIVE METABOLIC PANEL
ALT: 27 U/L (ref 14–54)
ANION GAP: 8 (ref 5–15)
AST: 44 U/L — ABNORMAL HIGH (ref 15–41)
Albumin: 2.4 g/dL — ABNORMAL LOW (ref 3.5–5.0)
Alkaline Phosphatase: 116 U/L (ref 38–126)
BUN: 9 mg/dL (ref 6–20)
CHLORIDE: 108 mmol/L (ref 101–111)
CO2: 20 mmol/L — AB (ref 22–32)
CREATININE: 0.71 mg/dL (ref 0.44–1.00)
Calcium: 7.1 mg/dL — ABNORMAL LOW (ref 8.9–10.3)
Glucose, Bld: 104 mg/dL — ABNORMAL HIGH (ref 65–99)
Potassium: 4 mmol/L (ref 3.5–5.1)
SODIUM: 136 mmol/L (ref 135–145)
Total Bilirubin: 0.6 mg/dL (ref 0.3–1.2)
Total Protein: 5.3 g/dL — ABNORMAL LOW (ref 6.5–8.1)

## 2017-08-14 NOTE — Progress Notes (Addendum)
Post Partum Day 1 Subjective: no complaints, up ad lib, voiding and tolerating PO. Patient was to receive 24hrs of Magnesium Sulfate postpartum but IV infiltrated at 0600. Patient refused to have new IV reinserted so received only about 21 hrs of Magnesium Sulfate (Dr. Su Hiltoberts aware).   Objective: Physical Exam:  General: alert and cooperative Lochia: appropriate Uterine Fundus: firm Incision: n/a DVT Evaluation: No evidence of DVT seen on physical exam. Negative Homan's sign. No cords or calf tenderness. No significant calf/ankle edema.  Vitals:   08/14/17 0408 08/14/17 0500 08/14/17 0600 08/14/17 0826  BP:  129/70  128/64  Pulse:  60  (!) 55  Resp: 20 18 20 18   Temp:  97.8 F (36.6 C)  97.9 F (36.6 C)  TempSrc:  Oral  Oral  SpO2:  97%  100%  Weight:      Height:       Results for orders placed or performed during the hospital encounter of 08/12/17 (from the past 24 hour(s))  CBC     Status: Abnormal   Collection Time: 08/14/17  5:42 AM  Result Value Ref Range   WBC 14.3 (H) 4.0 - 10.5 K/uL   RBC 3.37 (L) 3.87 - 5.11 MIL/uL   Hemoglobin 10.0 (L) 12.0 - 15.0 g/dL   HCT 96.029.5 (L) 45.436.0 - 09.846.0 %   MCV 87.5 78.0 - 100.0 fL   MCH 29.7 26.0 - 34.0 pg   MCHC 33.9 30.0 - 36.0 g/dL   RDW 11.914.3 14.711.5 - 82.915.5 %   Platelets 143 (L) 150 - 400 K/uL  Comprehensive metabolic panel     Status: Abnormal   Collection Time: 08/14/17  5:42 AM  Result Value Ref Range   Sodium 136 135 - 145 mmol/L   Potassium 4.0 3.5 - 5.1 mmol/L   Chloride 108 101 - 111 mmol/L   CO2 20 (L) 22 - 32 mmol/L   Glucose, Bld 104 (H) 65 - 99 mg/dL   BUN 9 6 - 20 mg/dL   Creatinine, Ser 5.620.71 0.44 - 1.00 mg/dL   Calcium 7.1 (L) 8.9 - 10.3 mg/dL   Total Protein 5.3 (L) 6.5 - 8.1 g/dL   Albumin 2.4 (L) 3.5 - 5.0 g/dL   AST 44 (H) 15 - 41 U/L   ALT 27 14 - 54 U/L   Alkaline Phosphatase 116 38 - 126 U/L   Total Bilirubin 0.6 0.3 - 1.2 mg/dL   GFR calc non Af Amer >60 >60 mL/min   GFR calc Af Amer >60 >60 mL/min    Anion gap 8 5 - 15    Assessment/Plan:  Plan for discharge tomorrow  AST elevated and platelets decreased today, repeat CBC and CMP in am (Dr. Su Hiltoberts aware)  Continue to monitor BPs   LOS: 2 days   Janeece Riggersllis K Atom Solivan 08/14/2017, 10:23 AM

## 2017-08-15 ENCOUNTER — Inpatient Hospital Stay (HOSPITAL_COMMUNITY): Payer: BLUE CROSS/BLUE SHIELD

## 2017-08-15 DIAGNOSIS — O326XX0 Maternal care for compound presentation, not applicable or unspecified: Secondary | ICD-10-CM | POA: Diagnosis present

## 2017-08-15 DIAGNOSIS — O99824 Streptococcus B carrier state complicating childbirth: Secondary | ICD-10-CM | POA: Diagnosis present

## 2017-08-15 DIAGNOSIS — R030 Elevated blood-pressure reading, without diagnosis of hypertension: Secondary | ICD-10-CM | POA: Diagnosis present

## 2017-08-15 DIAGNOSIS — O1414 Severe pre-eclampsia complicating childbirth: Secondary | ICD-10-CM | POA: Diagnosis present

## 2017-08-15 DIAGNOSIS — Z3A38 38 weeks gestation of pregnancy: Secondary | ICD-10-CM | POA: Diagnosis not present

## 2017-08-15 LAB — COMPREHENSIVE METABOLIC PANEL
ALT: 31 U/L (ref 14–54)
AST: 41 U/L (ref 15–41)
Albumin: 2.5 g/dL — ABNORMAL LOW (ref 3.5–5.0)
Alkaline Phosphatase: 102 U/L (ref 38–126)
Anion gap: 6 (ref 5–15)
BUN: 9 mg/dL (ref 6–20)
CO2: 21 mmol/L — ABNORMAL LOW (ref 22–32)
Calcium: 8.1 mg/dL — ABNORMAL LOW (ref 8.9–10.3)
Chloride: 109 mmol/L (ref 101–111)
Creatinine, Ser: 0.73 mg/dL (ref 0.44–1.00)
GFR calc Af Amer: >60 mL/min (ref >60)
GFR calc non Af Amer: >60 mL/min (ref >60)
Glucose, Bld: 82 mg/dL (ref 65–99)
Potassium: 4 mmol/L (ref 3.5–5.1)
Sodium: 136 mmol/L (ref 135–145)
Total Bilirubin: 0.3 mg/dL (ref 0.3–1.2)
Total Protein: 5.6 g/dL — ABNORMAL LOW (ref 6.5–8.1)

## 2017-08-15 LAB — CBC
HCT: 27.8 % — ABNORMAL LOW (ref 36.0–46.0)
Hemoglobin: 9.2 g/dL — ABNORMAL LOW (ref 12.0–15.0)
MCH: 29.6 pg (ref 26.0–34.0)
MCHC: 33.1 g/dL (ref 30.0–36.0)
MCV: 89.4 fL (ref 78.0–100.0)
Platelets: 144 10*3/uL — ABNORMAL LOW (ref 150–400)
RBC: 3.11 MIL/uL — ABNORMAL LOW (ref 3.87–5.11)
RDW: 14.6 % (ref 11.5–15.5)
WBC: 10.7 10*3/uL — ABNORMAL HIGH (ref 4.0–10.5)

## 2017-08-15 MED ORDER — NIFEDIPINE ER OSMOTIC RELEASE 30 MG PO TB24
30.0000 mg | ORAL_TABLET | Freq: Every day | ORAL | Status: DC
  Administered 2017-08-15 – 2017-08-16 (×2): 30 mg via ORAL
  Filled 2017-08-15 (×2): qty 1

## 2017-08-15 NOTE — Progress Notes (Signed)
Pt  Denies headache or visual changes BP (!) 179/93 (BP Location: Right Arm)   Pulse 69   Temp 98.3 F (36.8 C) (Oral)   Resp 18   Ht 5\' 6"  (1.676 m)   Wt 103 kg (227 lb)   LMP 11/15/2016   SpO2 100%   Breastfeeding? Unknown   BMI 36.64 kg/m  Pt has decided to take the procardia Will monitor closely

## 2017-08-15 NOTE — Progress Notes (Signed)
Discussed with patient the need to place seizure pads. Provided education about Procardia and about pre-eclampsia and eclampsia along with general information on hypertension per patient request. Patient requested not to have seizure pads placed and requested to see Dr. Normand Sloopillard again. Called MD per pt request.

## 2017-08-15 NOTE — Discharge Instructions (Signed)
Preeclampsia and Eclampsia °Preeclampsia is a serious condition that develops only during pregnancy. It is also called toxemia of pregnancy. This condition causes high blood pressure along with other symptoms, such as swelling and headaches. These symptoms may develop as the condition gets worse. Preeclampsia may occur at 20 weeks of pregnancy or later. °Diagnosing and treating preeclampsia early is very important. If not treated early, it can cause serious problems for you and your baby. One problem it can lead to is eclampsia, which is a condition that causes muscle jerking or shaking (convulsions or seizures) in the mother. Delivering your baby is the best treatment for preeclampsia or eclampsia. Preeclampsia and eclampsia symptoms usually go away after your baby is born. °What are the causes? °The cause of preeclampsia is not known. °What increases the risk? °The following risk factors make you more likely to develop preeclampsia: °· Being pregnant for the first time. °· Having had preeclampsia during a past pregnancy. °· Having a family history of preeclampsia. °· Having high blood pressure. °· Being pregnant with twins or triplets. °· Being 35 or older. °· Being African-American. °· Having kidney disease or diabetes. °· Having medical conditions such as lupus or blood diseases. °· Being very overweight (obese). ° °What are the signs or symptoms? °The earliest signs of preeclampsia are: °· High blood pressure. °· Increased protein in your urine. Your health care provider will check for this at every visit before you give birth (prenatal visit). ° °Other symptoms that may develop as the condition gets worse include: °· Severe headaches. °· Sudden weight gain. °· Swelling of the hands, face, legs, and feet. °· Nausea and vomiting. °· Vision problems, such as blurred or double vision. °· Numbness in the face, arms, legs, and feet. °· Urinating less than usual. °· Dizziness. °· Slurred speech. °· Abdominal pain,  especially upper abdominal pain. °· Convulsions or seizures. ° °Symptoms generally go away after giving birth. °How is this diagnosed? °There are no screening tests for preeclampsia. Your health care provider will ask you about symptoms and check for signs of preeclampsia during your prenatal visits. You may also have tests that include: °· Urine tests. °· Blood tests. °· Checking your blood pressure. °· Monitoring your baby’s heart rate. °· Ultrasound. ° °How is this treated? °You and your health care provider will determine the treatment approach that is best for you. Treatment may include: °· Having more frequent prenatal exams to check for signs of preeclampsia, if you have an increased risk for preeclampsia. °· Bed rest. °· Reducing how much salt (sodium) you eat. °· Medicine to lower your blood pressure. °· Staying in the hospital, if your condition is severe. There, treatment will focus on controlling your blood pressure and the amount of fluids in your body (fluid retention). °· You may need to take medicine (magnesium sulfate) to prevent seizures. This medicine may be given as an injection or through an IV tube. °· Delivering your baby early, if your condition gets worse. You may have your labor started with medicine (induced), or you may have a cesarean delivery. ° °Follow these instructions at home: °Eating and drinking ° °· Drink enough fluid to keep your urine clear or pale yellow. °· Eat a healthy diet that is low in sodium. Do not add salt to your food. Check nutrition labels to see how much sodium a food or beverage contains. °· Avoid caffeine. °Lifestyle °· Do not use any products that contain nicotine or tobacco, such as cigarettes   and e-cigarettes. If you need help quitting, ask your health care provider. °· Do not use alcohol or drugs. °· Avoid stress as much as possible. Rest and get plenty of sleep. °General instructions °· Take over-the-counter and prescription medicines only as told by your  health care provider. °· When lying down, lie on your side. This keeps pressure off of your baby. °· When sitting or lying down, raise (elevate) your feet. Try putting some pillows underneath your lower legs. °· Exercise regularly. Ask your health care provider what kinds of exercise are best for you. °· Keep all follow-up and prenatal visits as told by your health care provider. This is important. °How is this prevented? °To prevent preeclampsia or eclampsia from developing during another pregnancy: °· Get proper medical care during pregnancy. Your health care provider may be able to prevent preeclampsia or diagnose and treat it early. °· Your health care provider may have you take a low-dose aspirin or a calcium supplement during your next pregnancy. °· You may have tests of your blood pressure and kidney function after giving birth. °· Maintain a healthy weight. Ask your health care provider for help managing weight gain during pregnancy. °· Work with your health care provider to manage any long-term (chronic) health conditions you have, such as diabetes or kidney problems. ° °Contact a health care provider if: °· You gain more weight than expected. °· You have headaches. °· You have nausea or vomiting. °· You have abdominal pain. °· You feel dizzy or light-headed. °Get help right away if: °· You develop sudden or severe swelling anywhere in your body. This usually happens in the legs. °· You gain 5 lbs (2.3 kg) or more during one week. °· You have severe: °? Abdominal pain. °? Headaches. °? Dizziness. °? Vision problems. °? Confusion. °? Nausea or vomiting. °· You have a seizure. °· You have trouble moving any part of your body. °· You develop numbness in any part of your body. °· You have trouble speaking. °· You have any abnormal bleeding. °· You pass out. °This information is not intended to replace advice given to you by your health care provider. Make sure you discuss any questions you have with your health  care provider. °Document Released: 02/12/2000 Document Revised: 10/13/2015 Document Reviewed: 09/21/2015 °Elsevier Interactive Patient Education © 2018 [employer] ° °

## 2017-08-15 NOTE — Progress Notes (Signed)
Per phlebotomy Marylene Land(Angela) upon entering the room and explaining. Pt. Refused lab draw this AM.  Pt. Is known to be extremely particular with her medical care. Labs cancelled at this time to prevent other phlebotomists from having lab orders on their list and bothering patient. Can be addressed with the team during rounds and prior to d/c.

## 2017-08-15 NOTE — Lactation Note (Addendum)
This note was copied from a baby's chart. Lactation Consultation Note  Patient Name: Stephanie Hood Today's Date: 08/15/2017 Reason for consult: Follow-up assessment;Infant weight loss;Early term 37-38.6wks(5% weight loss )  Baby is 50 hours old, weight today - 5-5.7 oz Bili - 6/17 at 2117 - 7.9., voids and stools in the baby's life - 4 wets and 4 stools. LC asked mom and she hasn't been recording the voids and stools so the total may be more , since she can not recall. Baby has been breast feeding and supplementing after breast consistently. LC unable to assess feeding at the breast due to baby just having a feeding both at the breast breast and supplemented 35 ml of Neosure 22 cal after feeding.  Per mom nipples are not sore and breast are filling , hearing more swallows and comfortable with latch.  Has not had time to pump. Will have a DEBP Medela at home.  LC reviewed basics - and recommended STS feedings until the baby is back up to birth weight, gaining steadily, and can stay awake for a feeding.  Nutritive vs non - nutritive feeding patterns discussed and the importance of watching the baby for hanging out latched.  LC also recommended adding 4-5 post pumps both breast for 10 -15 mins to she can supplement EBM back to baby.  Sore nipple and engorgement prevention and tx reviewed. Per mom had engorgement with her last baby for a few days. LC also recommended keeping accurate I/O's on baby for at least 2 weeks , or recording on a amp on her phone.  Mother informed of post-discharge support and given phone number to the lactation department, including services for phone call assistance; out-patient appointments; and breastfeeding support group. List of other breastfeeding resources in the community given in the handout. Encouraged mother to call for problems or concerns related to breastfeeding.   Maternal Data Has patient been taught Hand Expression?: Yes  Feeding Feeding Type:  (recently fed ) Nipple Type: Slow - flow Length of feed: 30 min  LATCH Score                   Interventions Interventions: Breast feeding basics reviewed;DEBP  Lactation Tools Discussed/Used Tools: Pump Breast pump type: Double-Electric Breast Pump Pump Review: Milk Storage   Consult Status Consult Status: Complete Date: 08/15/17    Stephanie Hood 08/15/2017, 11:50 AM

## 2017-08-15 NOTE — Progress Notes (Addendum)
Post Partum Day 2 Subjective: Pt initially refused labs this am at 500 Discussed Preeclampsia in detail with pt and stressed importance of having labs drawn today since last labs AST was trensing up and Platelets were trending down. Pt agreed to this. B/p's 145/67-152/88 Objective: Blood pressure (!) 145/67, pulse 62, temperature 98.4 F (36.9 C), resp. rate 17, height 5\' 6"  (1.676 m), weight 227 lb (103 kg), last menstrual period 11/15/2016, SpO2 98 %, unknown if currently breastfeeding.  Physical Exam:  General: alert and cooperative Lochia: appropriate Uterine Fundus: firm Incision:  DVT Evaluation: No evidence of DVT seen on physical exam.  Recent Labs    08/13/17 0900 08/14/17 0542  HGB 11.1* 10.0*  HCT 33.4* 29.5*   CBC Latest Ref Rng & Units 08/14/2017 08/13/2017 08/12/2017  WBC 4.0 - 10.5 K/uL 14.3(H) 12.3(H) 7.9  Hemoglobin 12.0 - 15.0 g/dL 10.0(L) 11.1(L) 9.9(L)  Hematocrit 36.0 - 46.0 % 29.5(L) 33.4(L) 30.3(L)  Platelets 150 - 400 K/uL 143(L) 151 143(L)   CMP Latest Ref Rng & Units 08/14/2017 08/12/2017  Glucose 65 - 99 mg/dL 409(W104(H) 97  BUN 6 - 20 mg/dL 9 10  Creatinine 1.190.44 - 1.00 mg/dL 1.470.71 8.290.77  Sodium 562135 - 145 mmol/L 136 140  Potassium 3.5 - 5.1 mmol/L 4.0 3.7  Chloride 101 - 111 mmol/L 108 111  CO2 22 - 32 mmol/L 20(L) 20(L)  Calcium 8.9 - 10.3 mg/dL 7.1(L) 8.8(L)  Total Protein 6.5 - 8.1 g/dL 5.3(L) 5.5(L)  Total Bilirubin 0.3 - 1.2 mg/dL 0.6 0.5  Alkaline Phos 38 - 126 U/L 116 147(H)  AST 15 - 41 U/L 44(H) 38  ALT 14 - 54 U/L 27 27    Assessment/Plan: Discharge pending   LOS: 3 days   Lori A Clemmons CNM 08/15/2017, 9:11 AM  Pt seen and examined Labs are stable Will start procardia

## 2017-08-16 ENCOUNTER — Inpatient Hospital Stay (HOSPITAL_COMMUNITY): Payer: Self-pay

## 2017-08-16 MED ORDER — PRENATAL MULTIVITAMIN CH: 1.0000 | ORAL_TABLET | Freq: Every day | ORAL | 3 refills | Status: DC

## 2017-08-16 MED ORDER — NIFEDIPINE ER 30 MG PO TB24: 30.0000 mg | ORAL_TABLET | Freq: Every day | ORAL | 0 refills | Status: DC

## 2017-08-16 MED ORDER — FERROUS SULFATE 325 (65 FE) MG PO TBEC: 325.0000 mg | DELAYED_RELEASE_TABLET | Freq: Two times a day (BID) | ORAL | 3 refills | Status: AC

## 2017-08-16 MED ORDER — IBUPROFEN 600 MG PO TABS: 600.0000 mg | ORAL_TABLET | Freq: Four times a day (QID) | ORAL | 0 refills | Status: DC

## 2017-08-16 NOTE — Lactation Note (Signed)
This note was copied from a baby's chart. Lactation Consultation Note  Patient Name: Stephanie Hood Today's Date: 08/16/2017 Reason for consult: Follow-up assessment;Early term 37-38.6wks;Infant < 6lbs;Other (Comment);Hyperbilirubinemia(increase weight in past 24 hours and milk is in.) 76 hour female infant Per mom previous breastfeed 4th child for 3 years. Mom reported last feeding at 12 noon  and infant feed at breast  for 40 minutes but Mom changed to 30 minutes breastfeed first and then gave infant 10 ml of EBM.  Discussing with mom avoid prolong feedings at breast beyond 30 minutes due to gestational age.  LC discussed feed infant 8 to 12 times within 24 hours and to feed with feeding cues and  not to go beyond 3 hours without feeding infant.  Reviewed Baby and Mother care booklet how to  know how breastfeeding is going well and high recommend recording wet and soiled diapers with recorded feeding frequencies.  Dated extra diary sheets with dates given for the next 12 days. Baby is due to feed at 1500 and LC plans to revisit mom to observe a feeding. Mom denies sore nipples and  pumped 35ml of EBM and milk is in.   2nd visit baby awake when LC entered room mom is receptive of LC observing latch. Infant had yellow seedy diaper just [facility]. Mom used cradle hold pillows  were adjusted for support and infant had a wide gape.  LC could hear  audile swallows and  infant feed 15 minutes at breast  and was given 13 ml of EBM afterwards.  Maternal Data Does the patient have breastfeeding experience prior to this delivery?: Yes  Feeding Feeding Type: (Per Mom baby last feed at noon.) Length of feed: 30 min(per mom swallows and was comfortable latching )  LATCH Score                   Interventions Interventions: Breast feeding basics reviewed  Lactation Tools Discussed/Used Tools: Pump Breast pump type: Double-Electric Breast Pump;Other (comment)(Per  mom has DEBP medela at home.)   Consult Status Consult Status: Follow-up Date: 08/16/17 Follow-up type: In-patient    Danelle EarthlyRobin Amica Harron 08/16/2017, 2:16 PM

## 2017-08-16 NOTE — Discharge Summary (Signed)
SVD OB Discharge Summary     Patient Name: Stephanie Hood DOB: 1975/08/04 MRN: [id number]  Date of admission: 08/12/2017 Delivering MD: Geryl RankinsVARNADO, EVELYN  Date of delivery: 08/13/2017 Type of delivery: SVD  Newborn Data: Sex: BG Live born female  Birth Weight: 5 lb 9.8 oz (2545 g) APGAR: 8, 9  Newborn Delivery   Birth date/time:  08/13/2017 09:41:00 Delivery type:  Vaginal, Spontaneous     Feeding: breast Infant being discharge to home with mother in stable condition.   Admitting diagnosis: 38.4 WKS, HIGH BP, TIRED Intrauterine pregnancy: 5963w5d     Secondary diagnosis:  Active Problems:   Pre-eclampsia affecting pregnancy, antepartum                                Complications: None                                                              Intrapartum Procedures: spontaneous vaginal delivery and GBS prophylaxis Postpartum Procedures: Postpartum Mag x 21 hours Complications-Operative and Postpartum: 1st degree perineal laceration Augmentation: Cytotec   History of Present Illness: Stephanie Hood is a 42 y.o. female, [id number], who presents at 4663w5d weeks gestation. [facility]  Her pregnancy has been complicated by: IOL for the following  Patient Active Problem List   Diagnosis Date Noted  . Pre-eclampsia affecting pregnancy, antepartum 08/12/2017  AMA GBS +  Hospital course:  Induction of Labor With Vaginal Delivery   42 y.o. yo [phone] at 4963w5d was admitted to the hospital 08/12/2017 for induction of labor.  Indication for induction: AMA and Pre-E with severe features.  Patient had an uncomplicated labor course as follows: Membrane Rupture Time/Date: 5:30 AM ,08/13/2017   Intrapartum Procedures: Episiotomy: None [1]                                         Lacerations:  1st degree [2];Labial [10]  Patient had delivery of a Viable infant.  Information for the patient's newborn:  Jon GillsGumbs, Girl  Desmond LopeHyshaunda [[id number]][[id number]]  Delivery Method: Vag-Spont   08/13/2017  Details of delivery can be found in separate delivery note.  Patient had a routine postpartum course. Patient is discharged home 08/16/17. Postpartum Day # 4 : S/P NSVD due to IOL for Pre-E with severe features, GBS +, AMA. Patient up ad lib, denies syncope or dizziness. Reports consuming regular diet without issues and denies N/V. Patient reports 0 bowel movement + passing flatus.  Denies issues with urination and reports bleeding is "small."  Patient is Brestfeeding and reports going well.  Desires Talk about 6 weeks PP BTL, needs consent for postpartum contraception.  Pain is being appropriately managed with use of motrin and tylenol. Pt endorses same about of swelling, no vision changes, no epigastric pain, no RUQ pain, does endores small headache with onset post starting procardia. Resolves with meds.       Physical exam  Vitals:   08/16/17 0051 08/16/17 0457 08/16/17 0500 08/16/17 0857  BP: (!) 149/84 (!) 149/79  (!) 145/77  Pulse: 68  76  68  Resp: 18 18  16   Temp: 98.7 F (37.1 C) 98.8 F (37.1 C)  98.2 F (36.8 C)  TempSrc: Oral Oral  Oral  SpO2: 100% 100%  100%  Weight:   96.8 kg (213 lb 8 oz)   Height:       General: alert, cooperative and no distress Lochia: appropriate Uterine Fundus: firm Perineum: 1st degree laceration repaired healing well  DVT Evaluation: No evidence of DVT seen on physical exam. Negative Homan's sign. No cords or calf tenderness. Calf/Ankle edema is present, 2+ pitting edema bi-lat.   Labs: Lab Results  Component Value Date   WBC 10.7 (H) 08/15/2017   HGB 9.2 (L) 08/15/2017   HCT 27.8 (L) 08/15/2017   MCV 89.4 08/15/2017   PLT 144 (L) 08/15/2017   CMP Latest Ref Rng & Units 08/15/2017  Glucose 65 - 99 mg/dL 82  BUN 6 - 20 mg/dL 9  Creatinine 1.61 - 0.96 mg/dL 0.45  Sodium 409 - 811 mmol/L 136  Potassium 3.5 - 5.1 mmol/L 4.0  Chloride 101 - 111 mmol/L 109  CO2 22 - 32 mmol/L  21(L)  Calcium 8.9 - 10.3 mg/dL 8.1(L)  Total Protein 6.5 - 8.1 g/dL 9.1(Y)  Total Bilirubin 0.3 - 1.2 mg/dL 0.3  Alkaline Phos 38 - 126 U/L 102  AST 15 - 41 U/L 41  ALT 14 - 54 U/L 31    Date of discharge: 08/16/2017 Discharge Diagnoses: Term Pregnancy-delivered and Preelampsia Discharge instruction: per After Visit Summary and "Baby and Me Booklet".  After visit meds:  Allergies as of 08/16/2017   No Known Allergies     Medication List    TAKE these medications   acidophilus Caps capsule Take 1 capsule by mouth 3 times/day as needed-between meals & bedtime.   Black Elderberry 50 MG/5ML Syrp Take 5 mLs by mouth daily.   ferrous sulfate 325 (65 FE) MG EC tablet Take 1 tablet (325 mg total) by mouth 2 (two) times daily.   ibuprofen 600 MG tablet Commonly known as:  ADVIL,MOTRIN Take 1 tablet (600 mg total) by mouth every 6 (six) hours.   NIFEdipine 30 MG 24 hr tablet Commonly known as:  PROCARDIA-XL/ADALAT CC Take 1 tablet (30 mg total) by mouth daily.   prenatal multivitamin Tabs tablet Take 1 tablet by mouth daily at 12 noon.       Activity:           unrestricted Advance as tolerated. Pelvic rest for 6 weeks.  Diet:                routine Medications: PNV, Ibuprofen, Colace, Iron and procardia Postpartum contraception: Tubal Ligation Condition:  Pt discharge to home with baby in stable PreE- procardia Anemai: Iron   Meds: Allergies as of 08/16/2017   No Known Allergies     Medication List    TAKE these medications   acidophilus Caps capsule Take 1 capsule by mouth 3 times/day as needed-between meals & bedtime.   Black Elderberry 50 MG/5ML Syrp Take 5 mLs by mouth daily.   ferrous sulfate 325 (65 FE) MG EC tablet Take 1 tablet (325 mg total) by mouth 2 (two) times daily.   ibuprofen 600 MG tablet Commonly known as:  ADVIL,MOTRIN Take 1 tablet (600 mg total) by mouth every 6 (six) hours.   NIFEdipine 30 MG 24 hr tablet Commonly known as:   PROCARDIA-XL/ADALAT CC Take 1 tablet (30 mg total) by mouth daily.   prenatal  multivitamin Tabs tablet Take 1 tablet by mouth daily at 12 noon.       Discharge Follow Up:  Follow-up Information    [facility] Follow up.   Specialty:  Obstetrics and Gynecology Why:  1 week BP check and talk about signing BTL Papers, 6 week PPV Contact information: [address] [place] [address] [phone]        Baby Love to make home visit   [facility], NP-C, CNM 08/16/2017, 10:14 AM  [place], FNP

## 2017-08-22 ENCOUNTER — Inpatient Hospital Stay (HOSPITAL_COMMUNITY): Admission: RE | Admit: 2017-08-22 | Payer: Self-pay | Source: Ambulatory Visit

## 2019-01-22 ENCOUNTER — Other Ambulatory Visit: Payer: Self-pay

## 2019-01-22 DIAGNOSIS — Z20822 Contact with and (suspected) exposure to covid-19: Secondary | ICD-10-CM

## 2019-01-24 LAB — NOVEL CORONAVIRUS, NAA: SARS-CoV-2, NAA: DETECTED — AB

## 2019-09-12 LAB — HM PAP SMEAR

## 2020-01-15 ENCOUNTER — Other Ambulatory Visit: Payer: Self-pay | Admitting: Obstetrics & Gynecology

## 2020-02-06 ENCOUNTER — Encounter (HOSPITAL_COMMUNITY): Payer: Self-pay | Admitting: Vascular Surgery

## 2020-02-10 NOTE — Progress Notes (Signed)
[id number] [place], [place] - [address] AT [facility] [address] [facility] [address] [place] [address] Phone: [phone] Fax: [phone]      Your procedure is scheduled on Thursday 16th.  Report to [facility] Main Entrance "A" at 8:30 A.M., and check in at the Admitting office.  Call this number if you have problems the morning of surgery:  [phone]  Call [phone] if you have any questions prior to your surgery date Monday-Friday 8am-4pm    Remember:  Do not eat after midnight the night before your surgery  You may drink clear liquids until 7:30am the morning of your surgery.   Clear liquids allowed are: Water, Non-Citrus Juices (without pulp), Carbonated Beverages, Clear Tea, Black Coffee Only, and Gatorade    Take these medicines the morning of surgery with A SIP OF WATER   NIFEdipine (PROCARDIA-XL/ADALAT CC) 30 MG 24 hr tablet  IF NEEDED  acetaminophen (TYLENOL) 500 MG tablet    As of today, STOP taking any Aspirin (unless otherwise instructed by your surgeon) Aleve, Naproxen, Ibuprofen, Motrin, Advil, Goody's, BC's, all herbal medications, fish oil, and all vitamins.                      Do not wear jewelry, make up, or nail polish            Do not wear lotions, powders, perfumes, or deodorant.            Do not shave 48 hours prior to surgery.              Do not bring valuables to the hospital.            [facility] is not responsible for any belongings or valuables.  Do NOT Smoke (Tobacco/Vaping) or drink Alcohol 24 hours prior to your procedure If you use a CPAP at night, you may bring all equipment for your overnight stay.   Contacts, glasses, dentures or bridgework may not be worn into surgery.      For patients admitted to the hospital, discharge time will be determined by your treatment team.   Patients discharged the day of surgery will not be allowed to drive home, and someone needs to stay with them for 24  hours.    Special instructions:   [facility]- Preparing For Surgery  Before surgery, you can play an important role. Because skin is not sterile, your skin needs to be as free of germs as possible. You can reduce the number of germs on your skin by washing with CHG (chlorahexidine gluconate) Soap before surgery.  CHG is an antiseptic cleaner which kills germs and bonds with the skin to continue killing germs even after washing.    Oral Hygiene is also important to reduce your risk of infection.  Remember - BRUSH YOUR TEETH THE MORNING OF SURGERY WITH YOUR REGULAR TOOTHPASTE  Please do not use if you have an allergy to CHG or antibacterial soaps. If your skin becomes reddened/irritated stop using the CHG.  Do not shave (including legs and underarms) for at least 48 hours prior to first CHG shower. It is OK to shave your face.  Please follow these instructions carefully.   1. Shower the NIGHT BEFORE SURGERY and the MORNING OF SURGERY with CHG Soap.   2. If you chose to wash your hair, wash your hair first as usual with your normal shampoo.  3. After  you shampoo, rinse your hair and body thoroughly to remove the shampoo.  4. Use CHG as you would any other liquid soap. You can apply CHG directly to the skin and wash gently with a scrungie or a clean washcloth.   5. Apply the CHG Soap to your body ONLY FROM THE NECK DOWN.  Do not use on open wounds or open sores. Avoid contact with your eyes, ears, mouth and genitals (private parts). Wash Face and genitals (private parts)  with your normal soap.   6. Wash thoroughly, paying special attention to the area where your surgery will be performed.  7. Thoroughly rinse your body with warm water from the neck down.  8. DO NOT shower/wash with your normal soap after using and rinsing off the CHG Soap.  9. Pat yourself dry with a CLEAN TOWEL.  10. Wear CLEAN PAJAMAS to bed the night before surgery  11. Place CLEAN SHEETS on your bed the night of  your first shower and DO NOT SLEEP WITH PETS.   Day of Surgery: Wear Clean/Comfortable clothing the morning of surgery Do not apply any deodorants/lotions.   Remember to brush your teeth WITH YOUR REGULAR TOOTHPASTE.   Please read over the following fact sheets that you were given.

## 2020-02-11 ENCOUNTER — Inpatient Hospital Stay (HOSPITAL_COMMUNITY)
Admission: RE | Admit: 2020-02-11 | Discharge: 2020-02-11 | Disposition: A | Discharge status: Home / Self Care | Payer: BLUE CROSS/BLUE SHIELD | Source: Ambulatory Visit

## 2020-02-11 ENCOUNTER — Other Ambulatory Visit (HOSPITAL_COMMUNITY): Payer: BLUE CROSS/BLUE SHIELD

## 2020-02-13 ENCOUNTER — Encounter (HOSPITAL_COMMUNITY): Admission: RE | Payer: Self-pay | Source: Home / Self Care

## 2020-02-13 ENCOUNTER — Ambulatory Visit (HOSPITAL_COMMUNITY)
Admission: RE | Admit: 2020-02-13 | Payer: BC Managed Care – PPO | Source: Home / Self Care | Admitting: Obstetrics & Gynecology

## 2020-02-13 SURGERY — LIGATION, FALLOPIAN TUBE, LAPAROSCOPIC
Anesthesia: General | Laterality: Bilateral

## 2020-03-18 ENCOUNTER — Ambulatory Visit: Payer: BC Managed Care – PPO | Admitting: Medical

## 2020-06-08 ENCOUNTER — Other Ambulatory Visit: Payer: Self-pay

## 2020-06-09 ENCOUNTER — Ambulatory Visit: Payer: BC Managed Care – PPO | Admitting: Medical

## 2020-09-09 ENCOUNTER — Ambulatory Visit (INDEPENDENT_AMBULATORY_CARE_PROVIDER_SITE_OTHER): Payer: BC Managed Care – PPO

## 2020-09-09 ENCOUNTER — Other Ambulatory Visit: Payer: Self-pay

## 2020-09-09 ENCOUNTER — Ambulatory Visit: Payer: BC Managed Care – PPO | Admitting: Internal Medicine

## 2020-09-09 ENCOUNTER — Other Ambulatory Visit: Payer: Self-pay | Admitting: Internal Medicine

## 2020-09-09 ENCOUNTER — Encounter: Payer: Self-pay | Admitting: Internal Medicine

## 2020-09-09 VITALS — BP 142/86 | HR 67 | Temp 98.3°F | Resp 16 | Ht 66.0 in | Wt 175.0 lb

## 2020-09-09 DIAGNOSIS — M25552 Pain in left hip: Secondary | ICD-10-CM

## 2020-09-09 DIAGNOSIS — G8929 Other chronic pain: Secondary | ICD-10-CM

## 2020-09-09 DIAGNOSIS — I10 Essential (primary) hypertension: Secondary | ICD-10-CM | POA: Diagnosis not present

## 2020-09-09 DIAGNOSIS — Z0001 Encounter for general adult medical examination with abnormal findings: Secondary | ICD-10-CM

## 2020-09-09 DIAGNOSIS — Z1231 Encounter for screening mammogram for malignant neoplasm of breast: Secondary | ICD-10-CM | POA: Insufficient documentation

## 2020-09-09 DIAGNOSIS — R0683 Snoring: Secondary | ICD-10-CM | POA: Diagnosis not present

## 2020-09-09 DIAGNOSIS — Z1159 Encounter for screening for other viral diseases: Secondary | ICD-10-CM

## 2020-09-09 MED ORDER — NEBIVOLOL HCL 10 MG PO TABS: 10.0000 mg | ORAL_TABLET | Freq: Every day | ORAL | 1 refills | Status: AC

## 2020-09-09 NOTE — Progress Notes (Signed)
Eavy snoring   Subjective:  Patient ID: Stephanie Hood, female    DOB: 03/18/1975  Age: 45 y.o. MRN: [id number]  CC: Annual Exam and Hypertension  This visit occurred during the SARS-CoV-2 public health emergency.  Safety protocols were in place, including screening questions prior to the visit, additional usage of staff PPE, and extensive cleaning of exam room while observing appropriate contact time as indicated for disinfecting solutions.    HPI [name] presents for a CPX, f/up, and to establish.  She has a history of hypertension.  She has been intermittently taking the combination of an ARB and thiazide diuretic.  She does not think her blood pressure has been well controlled because she has had intermittent headaches.  She complains of insomnia and tells me her husband complains about her snoring.  She denies blurred vision, nausea, vomiting, chest pain, shortness of breath, diaphoresis, or edema.  She also complains of several month history of left hip pain.  The pain is exacerbated by movement.  She denies any recent trauma or injury.  She is taking Tylenol for the pain.   History Stephanie Hood has a past medical history of AMA (advanced maternal age) multigravida 35+ and Anemia.   She has a past surgical history that includes Knee surgery.   Her family history includes Hypertension in her mother; Kidney failure in her father.She reports that she has never smoked. She has never used smokeless tobacco. She reports current alcohol use. She reports that she does not use drugs.  Outpatient Medications Prior to Visit  Medication Sig Dispense Refill   acetaminophen (TYLENOL) 500 MG tablet Take 1,000 mg by mouth every 6 (six) hours as needed for mild pain or moderate pain.     acidophilus (RISAQUAD) CAPS capsule Take 1 capsule by mouth daily.     aspirin EC 81 MG tablet Take 81 mg by mouth daily as needed (Blood pressure). Swallow whole.     Black Elderberry 50 MG/5ML SYRP Take  10 mLs by mouth daily.     ibuprofen (ADVIL,MOTRIN) 600 MG tablet Take 1 tablet (600 mg total) by mouth every 6 (six) hours. (Patient taking differently: Take 400 mg by mouth every 6 (six) hours as needed for mild pain or moderate pain.) 30 tablet 0   NIFEdipine (PROCARDIA-XL/ADALAT CC) 30 MG 24 hr tablet Take 1 tablet (30 mg total) by mouth daily. 30 tablet 0   valsartan-hydrochlorothiazide (DIOVAN-HCT) 80-12.5 MG tablet Take 1 tablet by mouth daily.     ferrous sulfate 325 (65 FE) MG EC tablet Take 1 tablet (325 mg total) by mouth 2 (two) times daily. (Patient not taking: Reported on 02/07/2020) 60 tablet 3   No facility-administered medications prior to visit.    ROS Review of Systems  Constitutional:  Negative for diaphoresis and fatigue.  HENT: Negative.    Respiratory:  Negative for apnea, cough and shortness of breath.   Cardiovascular:  Negative for chest pain, palpitations and leg swelling.  Gastrointestinal:  Negative for abdominal pain, blood in stool, constipation, diarrhea and vomiting.  Genitourinary:  Negative for difficulty urinating, dysuria, frequency and urgency.  Musculoskeletal:  Positive for arthralgias. Negative for back pain.  Skin: Negative.   Neurological:  Positive for headaches. Negative for dizziness and weakness.  Hematological:  Negative for adenopathy. Does not bruise/bleed easily.  Psychiatric/Behavioral: Negative.     Objective:  BP (!) 142/86 (BP Location: Left Arm, Patient Position: Sitting, Cuff Size: Large)   Pulse 67   Temp 98.3  F (36.8 C) (Oral)   Resp 16   Ht 5\' 6"  (1.676 m)   Wt 175 lb (79.4 kg)   LMP 09/02/2020 (Exact Date)   SpO2 99%   BMI 28.25 kg/m   Physical Exam Vitals reviewed.  Constitutional:      Appearance: Normal appearance.  HENT:     Nose: Nose normal.     Mouth/Throat:     Mouth: Mucous membranes are moist.  Eyes:     General: No scleral icterus.    Conjunctiva/sclera: Conjunctivae normal.  Cardiovascular:      Rate and Rhythm: Normal rate and regular rhythm.     Heart sounds: No murmur heard. Pulmonary:     Effort: Pulmonary effort is normal.     Breath sounds: No stridor. No wheezing, rhonchi or rales.  Abdominal:     General: Abdomen is flat.     Palpations: There is no mass.     Tenderness: There is no abdominal tenderness. There is no guarding.     Hernia: No hernia is present.  Musculoskeletal:        General: Normal range of motion.     Cervical back: Normal and neck supple.     Thoracic back: Normal.     Lumbar back: Normal.     Right hip: Normal.     Left hip: Normal.     Right lower leg: No edema.     Left lower leg: No edema.  Lymphadenopathy:     Cervical: No cervical adenopathy.  Skin:    General: Skin is warm and dry.     Coloration: Skin is not pale.  Neurological:     General: No focal deficit present.     Mental Status: She is alert and oriented to person, place, and time. Mental status is at baseline.     Gait: Gait normal.     Deep Tendon Reflexes: Reflexes normal.  Psychiatric:        Mood and Affect: Mood normal.        Behavior: Behavior normal.    Lab Results  Component Value Date   WBC 10.7 (H) 08/15/2017   HGB 9.2 (L) 08/15/2017   HCT 27.8 (L) 08/15/2017   PLT 144 (L) 08/15/2017   GLUCOSE 82 08/15/2017   ALT 31 08/15/2017   AST 41 08/15/2017   NA 136 08/15/2017   K 4.0 08/15/2017   CL 109 08/15/2017   CREATININE 0.73 08/15/2017   BUN 9 08/15/2017   CO2 21 (L) 08/15/2017     Assessment & Plan:   Keayra was seen today for annual exam and hypertension.  Diagnoses and all orders for this visit:  Loud snoring -     Ambulatory referral to Sleep Studies  Encounter for general adult medical examination with abnormal findings- Exam completed, labs reviewed, vaccines reviewed, cancer screenings addressed, patient education was given. -     Hepatitis C antibody; Future  Primary hypertension- Her blood pressure is not adequately well controlled  and she is not using a reliable form of contraception.  I recommended that she stop taking the ARB and thiazide diuretic and switch to a beta-blocker.  I will check her labs to screen for secondary causes and endorgan damage. -     CBC with Differential/Platelet; Future -     Basic metabolic panel; Future -     TSH; Future -     VITAMIN D 25 Hydroxy (Vit-D Deficiency, Fractures); Future -     Urinalysis,  Routine w reflex microscopic; Future -     Hepatic function panel; Future -     hCG, quantitative, pregnancy; Future -     nebivolol (BYSTOLIC) 10 MG tablet; Take 1 tablet (10 mg total) by mouth daily.  Chronic left hip pain- Her exam is unremarkable.  Will check a plain film to see if there is any evidence of DJD. -     Cancel: DG Hip Unilat W OR W/O Pelvis Min 4 Views Left; Future  Need for hepatitis C screening test -     Hepatitis C antibody; Future  Visit for screening mammogram -     MM DIGITAL SCREENING BILATERAL; Future  I have discontinued Evoleht S. Junio's acidophilus, Black Elderberry, ibuprofen, NIFEdipine, valsartan-hydrochlorothiazide, and aspirin EC. I am also having her start on nebivolol. Additionally, I am having her maintain her ferrous sulfate and acetaminophen.  Meds ordered this encounter  Medications   nebivolol (BYSTOLIC) 10 MG tablet    Sig: Take 1 tablet (10 mg total) by mouth daily.    Dispense:  90 tablet    Refill:  1      Follow-up: Return in about 3 months (around 12/10/2020).  Sanda Linger, MD

## 2020-09-09 NOTE — Patient Instructions (Signed)

## 2020-09-11 ENCOUNTER — Encounter: Payer: Self-pay | Admitting: Internal Medicine

## 2022-10-30 DIAGNOSIS — Z419 Encounter for procedure for purposes other than remedying health state, unspecified: Secondary | ICD-10-CM | POA: Diagnosis not present

## 2022-11-29 DIAGNOSIS — Z419 Encounter for procedure for purposes other than remedying health state, unspecified: Secondary | ICD-10-CM | POA: Diagnosis not present

## 2022-12-30 DIAGNOSIS — Z419 Encounter for procedure for purposes other than remedying health state, unspecified: Secondary | ICD-10-CM | POA: Diagnosis not present

## 2023-01-29 DIAGNOSIS — Z419 Encounter for procedure for purposes other than remedying health state, unspecified: Secondary | ICD-10-CM | POA: Diagnosis not present

## 2023-03-01 DIAGNOSIS — Z419 Encounter for procedure for purposes other than remedying health state, unspecified: Secondary | ICD-10-CM | POA: Diagnosis not present

## 2023-03-10 DIAGNOSIS — N760 Acute vaginitis: Secondary | ICD-10-CM | POA: Diagnosis not present

## 2023-03-10 DIAGNOSIS — H6983 Other specified disorders of Eustachian tube, bilateral: Secondary | ICD-10-CM | POA: Diagnosis not present

## 2023-04-01 DIAGNOSIS — Z419 Encounter for procedure for purposes other than remedying health state, unspecified: Secondary | ICD-10-CM | POA: Diagnosis not present

## 2023-04-26 DIAGNOSIS — M779 Enthesopathy, unspecified: Secondary | ICD-10-CM | POA: Diagnosis not present

## 2023-06-30 IMAGING — DX DG HIP (WITH OR WITHOUT PELVIS) 2-3V*L*
3 series · 3 of 3 positions shown · non-contrast
Comparison: None.

CLINICAL DATA: Chronic left hip pain.

EXAM:
DG HIP (WITH OR WITHOUT PELVIS) 2-3V LEFT

[pelvis ap]
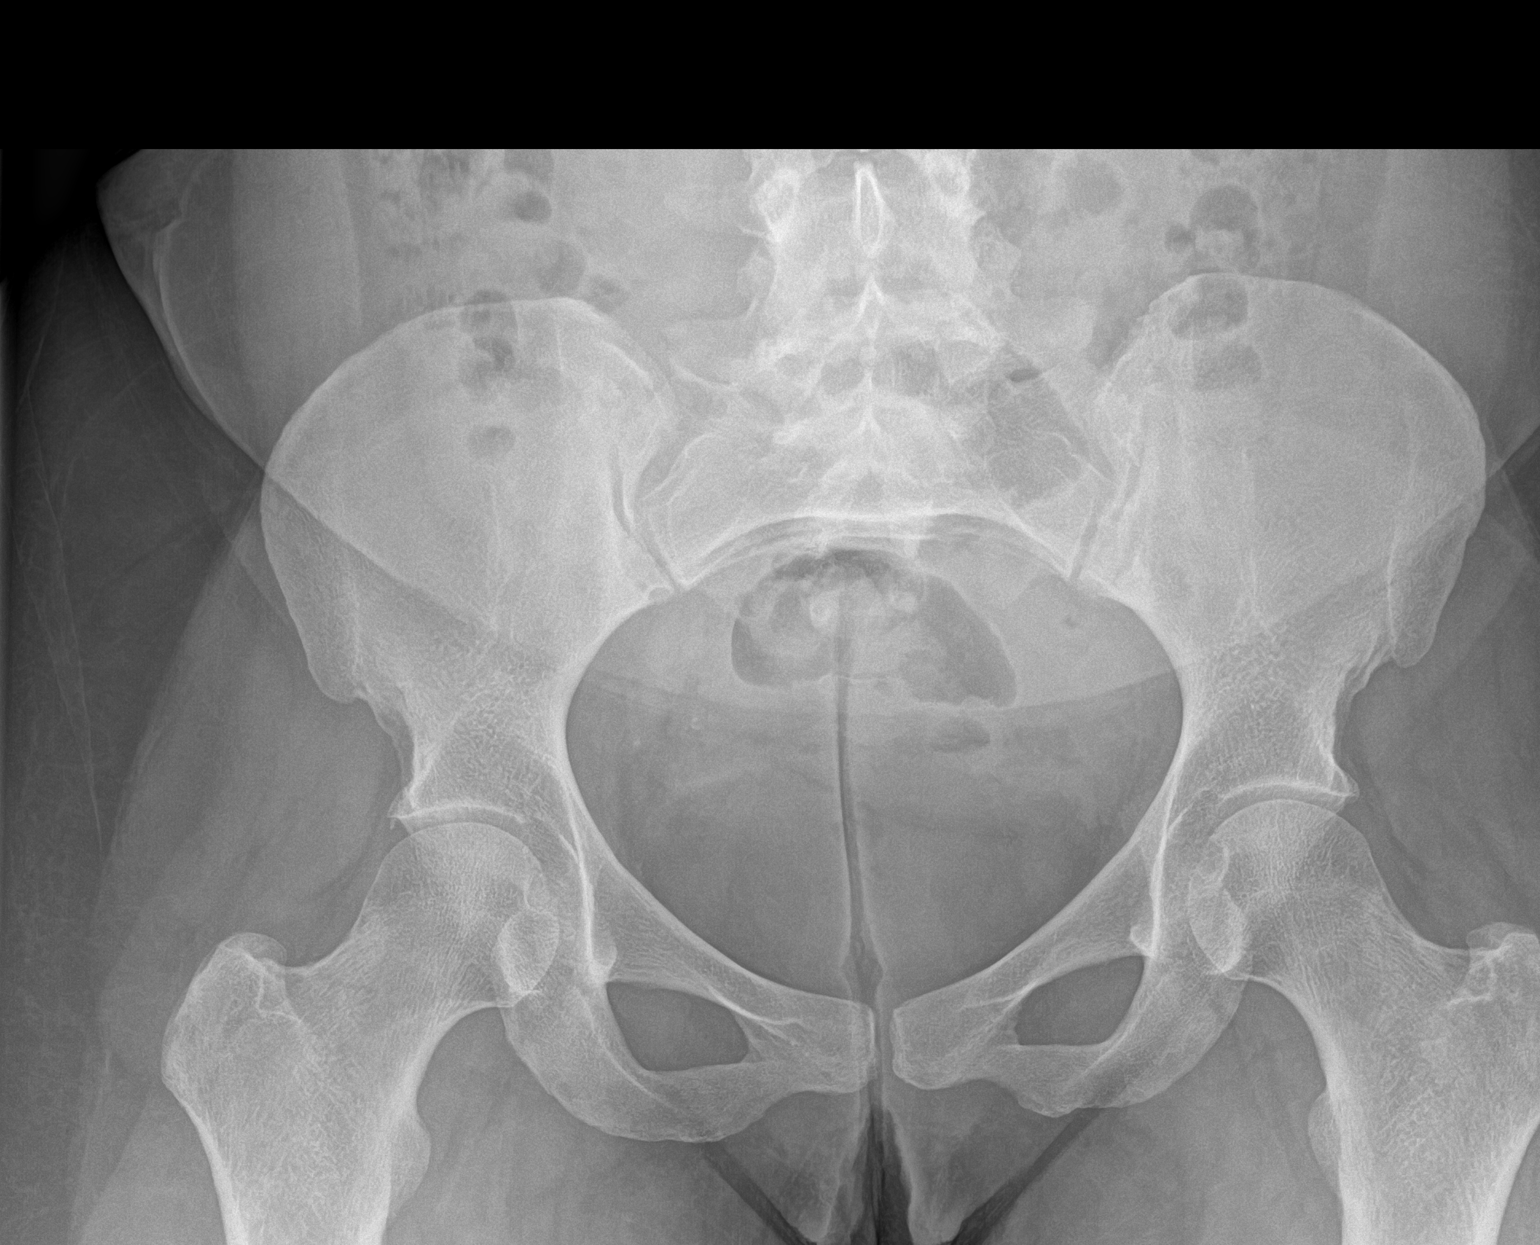

[hip ap]
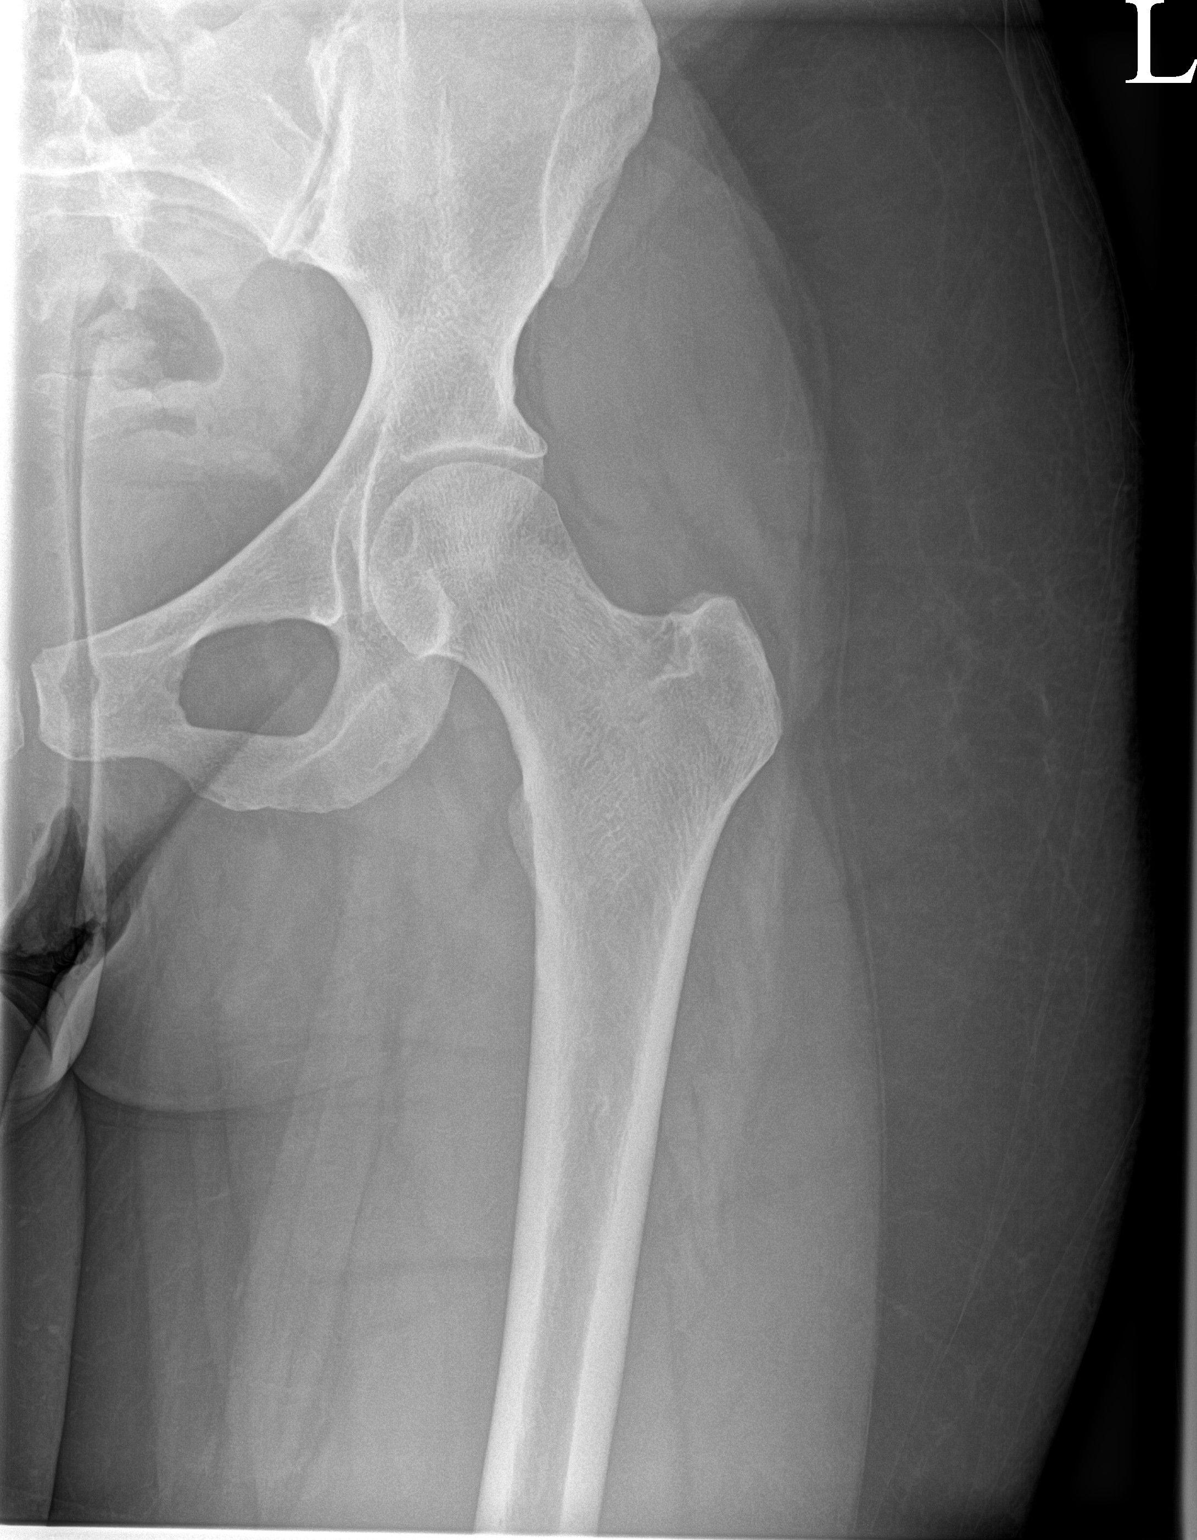

[hip frog leg]
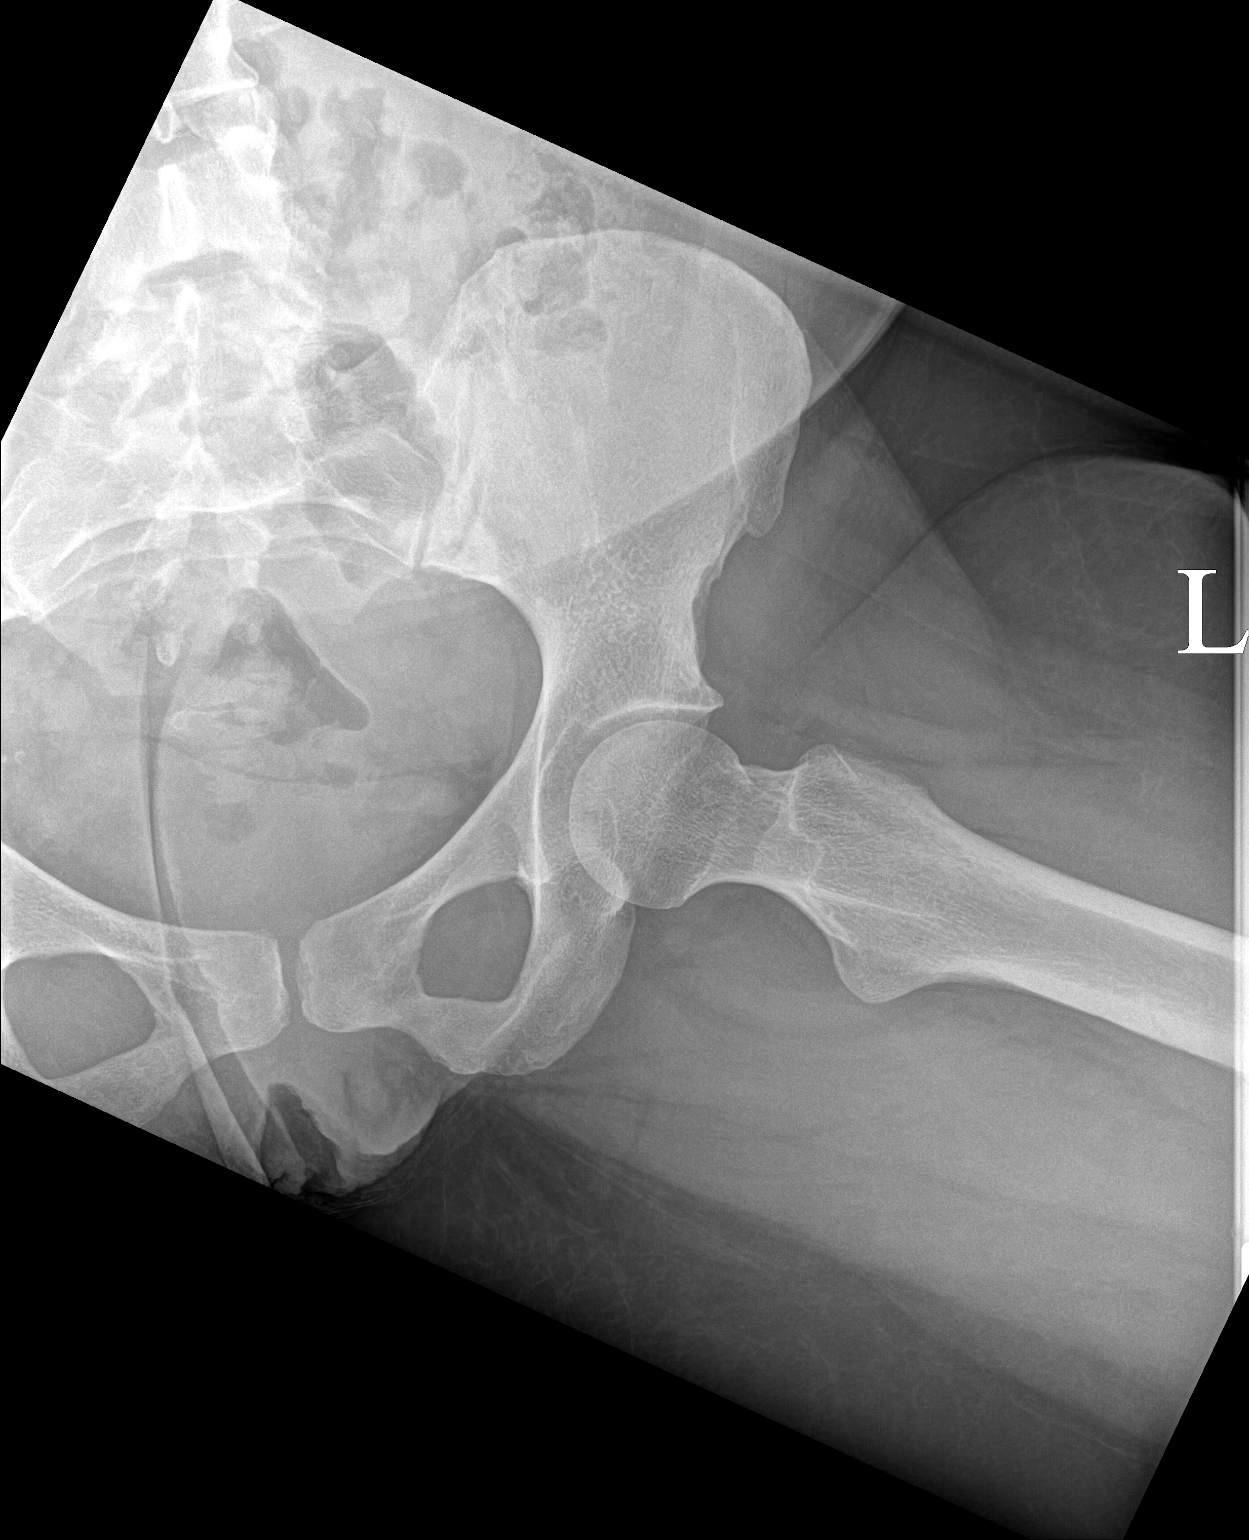

[3 of 3 positions shown; findings below may reference images not displayed]

FINDINGS: No fracture or bone lesion.

Hip joints, SI joints and symphysis pubis are normally spaced and
aligned. No arthropathic changes.

Soft tissues are unremarkable.
IMPRESSION: Negative.
